# Patient Record
Sex: Female | Born: 1990
Health system: Southern US, Community
[De-identification: ages and names within clinical notes are randomized; demographics above are authoritative.]

## PROBLEM LIST (undated history)

## (undated) DIAGNOSIS — K589 Irritable bowel syndrome without diarrhea: Secondary | ICD-10-CM

## (undated) DIAGNOSIS — I471 Supraventricular tachycardia, unspecified: Secondary | ICD-10-CM

## (undated) DIAGNOSIS — E079 Disorder of thyroid, unspecified: Secondary | ICD-10-CM

## (undated) DIAGNOSIS — R1310 Dysphagia, unspecified: Secondary | ICD-10-CM

## (undated) DIAGNOSIS — I959 Hypotension, unspecified: Secondary | ICD-10-CM

## (undated) DIAGNOSIS — R1013 Epigastric pain: Secondary | ICD-10-CM

## (undated) HISTORY — DX: Supraventricular tachycardia, unspecified: I47.10

## (undated) HISTORY — DX: Hypotension, unspecified: I95.9

## (undated) HISTORY — DX: Dysphagia, unspecified: R13.10

## (undated) HISTORY — DX: Irritable bowel syndrome, unspecified: K58.9

## (undated) HISTORY — PX: OTHER SURGICAL HISTORY: SHX169

## (undated) HISTORY — DX: Epigastric pain: R10.13

## (undated) HISTORY — DX: Supraventricular tachycardia: I47.1

---
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
## 2012-06-05 ENCOUNTER — Other Ambulatory Visit: Payer: Self-pay | Admitting: Gastroenterology

## 2012-06-05 DIAGNOSIS — R1013 Epigastric pain: Secondary | ICD-10-CM

## 2012-06-05 DIAGNOSIS — R1310 Dysphagia, unspecified: Secondary | ICD-10-CM

## 2012-06-07 ENCOUNTER — Ambulatory Visit
Admission: RE | Admit: 2012-06-07 | Discharge: 2012-06-07 | Disposition: A | Discharge status: Home / Self Care | Payer: 59 | Source: Ambulatory Visit | Attending: Gastroenterology | Admitting: Gastroenterology

## 2012-06-07 DIAGNOSIS — R1310 Dysphagia, unspecified: Secondary | ICD-10-CM

## 2012-06-07 DIAGNOSIS — R1013 Epigastric pain: Secondary | ICD-10-CM

## 2013-06-15 ENCOUNTER — Encounter (HOSPITAL_COMMUNITY): Payer: Self-pay | Admitting: Emergency Medicine

## 2013-06-15 ENCOUNTER — Emergency Department (HOSPITAL_COMMUNITY)
Admission: EM | Admit: 2013-06-15 | Discharge: 2013-06-15 | Disposition: A | Discharge status: Home / Self Care | Payer: 59 | Attending: Emergency Medicine | Admitting: Emergency Medicine

## 2013-06-15 DIAGNOSIS — Z3202 Encounter for pregnancy test, result negative: Secondary | ICD-10-CM | POA: Insufficient documentation

## 2013-06-15 DIAGNOSIS — R110 Nausea: Secondary | ICD-10-CM | POA: Insufficient documentation

## 2013-06-15 DIAGNOSIS — I471 Supraventricular tachycardia: Secondary | ICD-10-CM

## 2013-06-15 DIAGNOSIS — I498 Other specified cardiac arrhythmias: Secondary | ICD-10-CM | POA: Insufficient documentation

## 2013-06-15 DIAGNOSIS — Z87891 Personal history of nicotine dependence: Secondary | ICD-10-CM | POA: Insufficient documentation

## 2013-06-15 LAB — BASIC METABOLIC PANEL
CO2: 25 mEq/L (ref 19–32)
GFR calc Af Amer: >90 mL/min (ref >90)
GFR calc non Af Amer: >90 mL/min (ref >90)
Potassium: 3.6 mEq/L (ref 3.5–5.1)
Sodium: 140 mEq/L (ref 135–145)

## 2013-06-15 LAB — CBC WITH DIFFERENTIAL/PLATELET
Basophils Relative: 0 % (ref 0–1)
HCT: 44.9 % (ref 36.0–46.0)
Hemoglobin: 15 g/dL (ref 12.0–15.0)
MCH: 28.5 pg (ref 26.0–34.0)
MCHC: 33.4 g/dL (ref 30.0–36.0)
Monocytes Absolute: 0.7 10*3/uL (ref 0.1–1.0)
Monocytes Relative: 7 % (ref 3–12)
Neutro Abs: 6.9 10*3/uL (ref 1.7–7.7)
Platelets: 301 10*3/uL (ref 150–400)
RBC: 5.27 MIL/uL — ABNORMAL HIGH (ref 3.87–5.11)

## 2013-06-15 LAB — RAPID URINE DRUG SCREEN, HOSP PERFORMED: Tetrahydrocannabinol: NOT DETECTED

## 2013-06-15 LAB — D-DIMER, QUANTITATIVE: D-Dimer, Quant: 0.28 ug/mL-FEU (ref 0.00–0.48)

## 2013-06-15 MED ORDER — METOPROLOL TARTRATE 1 MG/ML IV SOLN: 2.5000 mg | Freq: Once | INTRAVENOUS | Status: DC

## 2013-06-15 MED ORDER — SODIUM CHLORIDE 0.9 % IV BOLUS (SEPSIS)
1000.0000 mL | Freq: Once | INTRAVENOUS | Status: AC
  Administered 2013-06-15: 1000 mL via INTRAVENOUS

## 2013-06-15 MED ORDER — ADENOSINE 6 MG/2ML IV SOLN
INTRAVENOUS | Status: AC
  Administered 2013-06-15: 6 mg
  Filled 2013-06-15: qty 2

## 2013-06-15 MED ORDER — ADENOSINE 6 MG/2ML IV SOLN
INTRAVENOUS | Status: AC
  Administered 2013-06-15: 12 mg
  Filled 2013-06-15: qty 4

## 2013-06-15 MED ORDER — ONDANSETRON HCL 4 MG/2ML IJ SOLN
4.0000 mg | Freq: Once | INTRAMUSCULAR | Status: AC
  Administered 2013-06-15: 4 mg via INTRAVENOUS
  Filled 2013-06-15: qty 2

## 2013-06-15 NOTE — ED Provider Notes (Signed)
CSN: [id number]     Arrival date & time 06/15/13  1801 History   First MD Initiated Contact with Patient 06/15/13 1824     Chief Complaint  Patient presents with  . Tachycardia   (Consider location/radiation/quality/duration/timing/severity/associated sxs/prior Treatment) HPI Onset was about an hour ago, sudden, contant.  The pain is moderate pain to central chest. Modifying factors: none.  Associated symptoms: nausea, no SOB.  Recent medical care: none. Pt reports similar symptoms about 9 times in the past but they have always resolved in about 30 minutes. Never received medical care for this.    History reviewed. No pertinent past medical history. History reviewed. No pertinent past surgical history. History reviewed. No pertinent family history. History  Substance Use Topics  . Smoking status: Former Games developer  . Smokeless tobacco: Never Used  . Alcohol Use: Not on file   OB History   Grav Para Term Preterm Abortions TAB SAB Ect Mult Living                 Review of Systems Constitutional: Negative for fever.  Eyes: Negative for vision loss.  ENT: Negative for difficulty swallowing.  Cardiovascular: Positive for chest pain. Respiratory: Negative for respiratory distress.  Gastrointestinal:  Negative for vomiting.  Genitourinary: Negative for inability to void.  Musculoskeletal: Negative for gait problem.  Integumentary: Negative for rash.  Neurological: Negative for new focal weakness.     Allergies  Review of patient's allergies indicates no known allergies.  Home Medications   Current Outpatient Rx  Name  Route  Sig  Dispense  Refill  . gabapentin (NEURONTIN) 300 MG capsule   Oral   Take 300 mg by mouth daily as needed. For pain          BP 123/74  Pulse 120  Temp(Src) 99.1 F (37.3 C) (Oral)  Resp 15  SpO2 100%  LMP 06/08/2013 Physical Exam Nursing note and vitals reviewed.  Constitutional: Pt is alert and appears stated age. Uncomfortable appearing.   Eyes: No injection, no scleral icterus. HENT: Atraumatic, airway open without erythema or exudate.  Respiratory: No respiratory distress. Equal breathing bilaterally. Cardiovascular: Regular rhythm, tachycardic rate. Extremities warm and well perfused.  Abdomen: Soft, non-tender. MSK: Extremities are atraumatic without deformity. Skin: No rash, no wounds.   Neuro: No motor nor sensory deficit.     ED Course  Procedures (including critical care time) Labs Review Labs Reviewed  CBC WITH DIFFERENTIAL - Abnormal; Notable for the following:    RBC 5.27 (*)    All other components within normal limits  BASIC METABOLIC PANEL  D-DIMER, QUANTITATIVE  URINE RAPID DRUG SCREEN (HOSP PERFORMED)  TSH  POCT PREGNANCY, URINE   Imaging Review No results found.  EKG Interpretation     Ventricular Rate:  95 PR Interval:  163 QRS Duration: 90 QT Interval:  351 QTC Calculation: 441 R Axis:   42 Text Interpretation:  Sinus rhythm now sinus rhythm            MDM   1. SVT (supraventricular tachycardia)    22 y.o. female w/ PMHx of palpitations presents in SVT, HR 190 - 200. Pt stable with normal mental status, blood pressure. Given adenosine 6 mg with no change. 12 mg with resolution. Pt monitored in ED without event. Repeat EKG sinus rhythm. HR <100 on my re-eval. D dimer neg. Not PE. CBC without anemia. BMP without electrolyte abnormality. U preg neg. TSH pending. Uncertain cause for SVT. Will have pt f/u with  cards. Counseling provided regarding diagnosis, treatment plan, follow up recommendations, and return precautions. Questions answer       I independently viewed, interpreted, and used in my medical decision making all ordered lab and imaging tests. Medical Decision Making discussed with ED attending Glynn Octave, MD      Charm Barges, MD 06/15/13 2026

## 2013-06-15 NOTE — ED Provider Notes (Signed)
I saw and evaluated the patient, reviewed the resident's note and I agree with the findings and plan. If applicable, I agree with the resident's interpretation of the EKG.  If applicable, I was present for critical portions of any procedures performed.  One hour palpitations with chest pain shortness of breath. SVT with rate of 200. Adenosine x 2 with transition to sinus tach.  D-dimer negative.  CRITICAL CARE Performed by: Glynn Octave Total critical care time: 30 Critical care time was exclusive of separately billable procedures and treating other patients. Critical care was necessary to treat or prevent imminent or life-threatening deterioration. Critical care was time spent personally by me on the following activities: development of treatment plan with patient and/or surrogate as well as nursing, discussions with consultants, evaluation of patient's response to treatment, examination of patient, obtaining history from patient or surrogate, ordering and performing treatments and interventions, ordering and review of laboratory studies, ordering and review of radiographic studies, pulse oximetry and re-evaluation of patient's condition.   Glynn Octave, MD 06/15/13 2141

## 2013-06-15 NOTE — ED Notes (Signed)
Pt was with mother getting something to eat, started to take a sip of soda and her heart began to race,

## 2013-06-15 NOTE — ED Notes (Signed)
Family at bedside. 

## 2013-06-15 NOTE — ED Notes (Signed)
Pt HR did not respond to initial dose of adenosine, preparing to administer second dose per MD Rancour

## 2013-06-21 ENCOUNTER — Ambulatory Visit (INDEPENDENT_AMBULATORY_CARE_PROVIDER_SITE_OTHER): Payer: 59 | Admitting: Cardiovascular Disease

## 2013-06-21 ENCOUNTER — Encounter: Payer: Self-pay | Admitting: Cardiovascular Disease

## 2013-06-21 VITALS — BP 122/80 | HR 81 | Ht 67.0 in | Wt 153.4 lb

## 2013-06-21 DIAGNOSIS — I471 Supraventricular tachycardia: Secondary | ICD-10-CM | POA: Insufficient documentation

## 2013-06-21 NOTE — Progress Notes (Signed)
06/21/2013 Christine Moreno   1991-07-05  [id number]  Primary Physician Tomi Bamberger, NP Primary Cardiologist: Runell Gess MD Roseanne Reno   HPI:  Christine Moreno is a playful 22 year old single Caucasian female who works at a [employer] closed. [facility] for evaluation of PSVT which recently was pharmacologically terminated using IV adenosine. She's had 5 episodes per her lifetime most recently several days ago and prior to that one month ago. She denies caffeine intake, excessive alcohol use or illicit drugs. She stopped smoking 2 years ago. There is a family history of heart disease. During this episode she feels presyncopal and gets chest pain or shortness of breath. There is no evidence of preexcitation on her EKG.   Current Outpatient Prescriptions  Medication Sig Dispense Refill  . clindamycin (CLEOCIN T) 1 % lotion Apply 1 application topically daily.      . Doxycycline Hyclate-Cleanser (MORGIDOX) 2 X 100 MG KIT Apply 1 application topically daily.      Marland Kitchen EPIDUO 0.1-2.5 % gel Apply 1 application topically daily.      Marland Kitchen gabapentin (NEURONTIN) 300 MG capsule Take 300 mg by mouth daily as needed. For pain      . mesalamine (LIALDA) 1.2 G EC tablet Take 2,400 mg by mouth daily with breakfast.      . MINOCYCLINE HCL PO Take 1 tablet by mouth 2 (two) times daily.       No current facility-administered medications for this visit.    No Known Allergies  History   Social History  . Marital Status: Single    Spouse Name: N/A    Number of Children: N/A  . Years of Education: N/A   Occupational History  . Not on file.   Social History Main Topics  . Smoking status: Former Smoker    Quit date: 02/21/2011  . Smokeless tobacco: Never Used  . Alcohol Use: Not on file  . Drug Use: Not on file  . Sexual Activity: Not on file   Other Topics Concern  . Not on file   Social History Narrative  . No narrative on file     Review of  Systems: General: negative for chills, fever, night sweats or weight changes.  Cardiovascular: negative for chest pain, dyspnea on exertion, edema, orthopnea, palpitations, paroxysmal nocturnal dyspnea or shortness of breath Dermatological: negative for rash Respiratory: negative for cough or wheezing Urologic: negative for hematuria Abdominal: negative for nausea, vomiting, diarrhea, bright red blood per rectum, melena, or hematemesis Neurologic: negative for visual changes, syncope, or dizziness All other systems reviewed and are otherwise negative except as noted above.    Blood pressure 122/80, pulse 81, height 5\' 7"  (1.702 m), weight 153 lb 6.4 oz (69.582 kg), last menstrual period 06/08/2013.  General appearance: alert and no distress Neck: no adenopathy, no carotid bruit, no JVD, supple, symmetrical, trachea midline and thyroid not enlarged, symmetric, no tenderness/mass/nodules Lungs: clear to auscultation bilaterally Heart: regular rate and rhythm, S1, S2 normal, no murmur, click, rub or gallop Abdomen: soft, non-tender; bowel sounds normal; no masses,  no organomegaly Extremities: extremities normal, atraumatic, no cyanosis or edema Pulses: 2+ and symmetric  EKG sinus rhythm at 81 with RSR prime in leads V1 and V2 suggesting RV conduction delay.  ASSESSMENT AND PLAN:   PSVT (paroxysmal supraventricular tachycardia) Patient is a 22 year old single Caucasian female Caucasian female referred by [facility] ER for recent episode of PSVT. Her primary care provider is Tomi Bamberger, nurse practitioner in [place] .  She has no cardiac risk factors other than family history (Father had stents and bypass surgery in his early 72s). She stopped  smoking 2 years ago. She does not use illicit drugs and drinks socially. She does have irritable bowel syndrome. She's had 5 episodes of PSVT prior left eye beginning in early childhood and again in September and most recently this past Friday. Her 196. She received 2  intravenous doses of adenosine in the emergency room sinus rhythm. She denies other stimulants or exacerbating factors.      Runell Gess MD FACP,FACC,FAHA, [facility] 06/21/2013 4:27 PM

## 2013-06-21 NOTE — Assessment & Plan Note (Signed)
Patient is a 22 year old single Caucasian female referred by [facility] ER for recent episode of PSVT. Her primary care provider is Tomi Bamberger, nurse practitioner in [place] . She has no cardiac risk factors other than family history (Father had stents and bypass surgery in his early 68s). She stopped  smoking 2 years ago. She does not use illicit drugs and drinks socially. She does have irritable bowel syndrome. She's had 5 episodes of PSVT prior left eye beginning in early childhood and again in September and most recently this past Friday. Her 196. She received 2 intravenous doses of adenosine in the emergency room sinus rhythm. She denies other stimulants or exacerbating factors.

## 2013-06-21 NOTE — Patient Instructions (Signed)
Follow up with Dr Allyson Sabal only as needed.  Dr Allyson Sabal has referred you to Dr Johney Frame (the electrophysiologist for the PSVT your experiencing)  Dr Allyson Sabal has ordered an echocardiogram

## 2013-07-11 ENCOUNTER — Ambulatory Visit (HOSPITAL_COMMUNITY)
Admission: RE | Admit: 2013-07-11 | Discharge: 2013-07-11 | Disposition: A | Discharge status: Home / Self Care | Payer: 59 | Source: Ambulatory Visit | Attending: Cardiovascular Disease | Admitting: Cardiovascular Disease

## 2013-07-11 DIAGNOSIS — Z87891 Personal history of nicotine dependence: Secondary | ICD-10-CM | POA: Insufficient documentation

## 2013-07-11 DIAGNOSIS — R0989 Other specified symptoms and signs involving the circulatory and respiratory systems: Secondary | ICD-10-CM | POA: Insufficient documentation

## 2013-07-11 DIAGNOSIS — Z8249 Family history of ischemic heart disease and other diseases of the circulatory system: Secondary | ICD-10-CM | POA: Insufficient documentation

## 2013-07-11 DIAGNOSIS — I471 Supraventricular tachycardia, unspecified: Secondary | ICD-10-CM | POA: Insufficient documentation

## 2013-07-11 DIAGNOSIS — R079 Chest pain, unspecified: Secondary | ICD-10-CM | POA: Insufficient documentation

## 2013-07-11 DIAGNOSIS — R0609 Other forms of dyspnea: Secondary | ICD-10-CM | POA: Insufficient documentation

## 2013-07-11 NOTE — Progress Notes (Signed)
2D Echo Performed 07/11/2013    Devery Murgia, RCS  

## 2013-07-16 ENCOUNTER — Encounter: Payer: Self-pay | Admitting: *Deleted

## 2013-07-25 ENCOUNTER — Ambulatory Visit (INDEPENDENT_AMBULATORY_CARE_PROVIDER_SITE_OTHER): Payer: 59 | Admitting: Internal Medicine

## 2013-07-25 ENCOUNTER — Encounter: Payer: Self-pay | Admitting: Internal Medicine

## 2013-07-25 VITALS — BP 121/81 | HR 75 | Ht 67.0 in | Wt 155.8 lb

## 2013-07-25 DIAGNOSIS — I471 Supraventricular tachycardia: Secondary | ICD-10-CM

## 2013-07-25 DIAGNOSIS — I498 Other specified cardiac arrhythmias: Secondary | ICD-10-CM

## 2013-07-25 MED ORDER — DILTIAZEM HCL 30 MG PO TABS: 30.0000 mg | ORAL_TABLET | Freq: Four times a day (QID) | ORAL | Status: DC

## 2013-07-25 NOTE — Patient Instructions (Signed)
Your physician recommends that you schedule a follow-up appointment in: 3 months with Dr Johney Frame  Your physician has recommended you make the following change in your medication:  1) Start Diltiazem 30mg  every 6 hours as needed for palpitations  Call Anselm Pancoast if you want to schedule ablation

## 2013-07-25 NOTE — Progress Notes (Signed)
Primary Care Physician: Tomi Bamberger, NP Referring Physician:  Dr Corbin Ade is a 22 y.o. female with a h/o SVT who presents for Ep consultation.  She reports having abrupt onset/ gradual offset of tachypalpitations at around age 23.  This was only a single episode and terminated abruptly.  She [facility] then and her vitals were "OK".  She did not seek medicine attention.  She did well until this past year.  She has begun having symptoms of recurrent tachypalpitations.  She reports abrupt onset/ gradual offset of tachypalpitations.  She reports associated SOB, chest tightness and presyncope.  She has not had syncope.  She reports that her heart rates are over 200 bpm at the time.  Episodes last from 30 minutes to an hour.  She has tried vagal maneuvers without improvement.  She is unaware of any triggers or precipitants.  She thinks that she has had 5 episodes of tachycardia.  Her most recent episode occurred 10/14 and was terminated with adenosine.  She was documented to have a short RP SVT at 193 bpm.  She was referred to Dr Allyson Sabal and had an echo obtained.  She is referred for EP consultation.    She does have a h/o syncope several years ago (between the ages of 58 and 89).  She was evaluated by Dr Lily Kocher and diagnosed with POTS.  She was treated with hydration and flornef.  This has nearly resolved.  She rare postural dizziness.  Today, she denies symptoms of exertional chest pain, shortness of breath,  lower extremity edema, or neurologic sequela.  She reports chronic L ankle swelling since age 19 or so.  The patient is tolerating medications without difficulties and is otherwise without complaint today.   Past Medical History  Diagnosis Date  . Hypotension     previously treated for POTS by Dr Lily Kocher  . PSVT (paroxysmal supraventricular tachycardia)     Has had 5 total episodes in her life. One was when she went to ED 06/15/13 and one other about a month earlier.    . IBS (irritable bowel syndrome)   . Epigastric pain   . Dysphagia    Past Surgical History  Procedure Laterality Date  . Appendectomy  08/1993  . Left knee arthroscopy      Current Outpatient Prescriptions  Medication Sig Dispense Refill  . Doxycycline Hyclate-Cleanser 2 X 100 MG KIT Apply 1 application topically daily.      Marland Kitchen EPIDUO 0.1-2.5 % gel Apply 1 application topically daily.      Marland Kitchen gabapentin (NEURONTIN) 300 MG capsule Take 300 mg by mouth daily as needed. For pain       No current facility-administered medications for this visit.    No Known Allergies  History   Social History  . Marital Status: Single    Spouse Name: N/A    Number of Children: N/A  . Years of Education: N/A   Occupational History  . Not on file.   Social History Main Topics  . Smoking status: Former Smoker    Quit date: 02/21/2011  . Smokeless tobacco: Never Used  . Alcohol Use: Yes     Comment: Socially  . Drug Use: No  . Sexual Activity: Not on file   Other Topics Concern  . Not on file   Social History Narrative   Lives in [place] with her parents.  She works at a [employer], materials.    Family History  Problem Relation  Age of Onset  . Hyperlipidemia Mother   . Hypertension Father   . Hyperlipidemia Father   . Heart disease Father     Stent & bypass surgery in early 12s  . Hypertension Sister 59  . Heart disease Maternal Grandfather     ROS- All systems are reviewed and negative except as per the HPI above  Physical Exam: Filed Vitals:   07/25/13 1045  BP: 121/81  Pulse: 75  Height: 5\' 7"  (1.702 m)  Weight: 155 lb 12.8 oz (70.67 kg)    GEN- The patient is anxious appearing, alert and oriented x 3 today.   Head- normocephalic, atraumatic Eyes-  Sclera clear, conjunctiva pink Ears- hearing intact Oropharynx- clear, she has two earring type studs in the angles of her mouth Neck- supple, no JVP Lymph- no cervical lymphadenopathy Lungs- Clear to ausculation  bilaterally, normal work of breathing Heart- Regular rate and rhythm, no murmurs, rubs or gallops, PMI not laterally displaced GI- soft, NT, ND, + BS Extremities- no clubbing, cyanosis, or edema MS- no significant deformity or atrophy Skin- no rash or lesion Psych- euthymic mood, full affect Neuro- strength and sensation are intact  EKG 10/14 reveals short RP SVT at 193 bpm ekg today reveals sinus rhythm 75 bpm, PR 138, QRS 84, Qtc 410, incomplete RBBB Echo 11/14 is reviewed Dr Hazle Coca records are reviewed  Assessment and Plan:  The patient has symptomatic paroxysmal supraventricular tachycardia.  This has been documented to be short RP by recent ekg. Therapeutic strategies for supraventricular tachycardia including medicine and ablation were discussed in detail with the patient today. Risk, benefits, and alternatives to EP study and radiofrequency ablation were also discussed in detail today. At this time, she wishes to defer ablation.  She may reconsider with time.  For now, she would like to take diltiazem on an as needed basis.  If her SVT burden increases or should she decide to pursue ablation then she will contact my office.  I do think that she is a good candidate for ablation and have provided her with educational material on ablation today.  I will see her back in 3 months for further discussion.

## 2013-10-03 IMAGING — CR DG UGI W/ SMALL BOWEL HIGH DENSITY
6 series · 6 of 6 positions shown · non-contrast
Comparison: None.

CLINICAL DATA: Epigastric pain and dysphagia.

UPPER GI W/ SMALL BOWEL HIGH DENSITY
TECHNIQUE: Upper GI series performed with high density barium and
effervescent agent. Thin barium also used. Subsequently, serial
images of the small bowel were obtained including spot views of the
terminal ileum.
Fluoroscopy Time:

[view not recorded (1 of 6)]
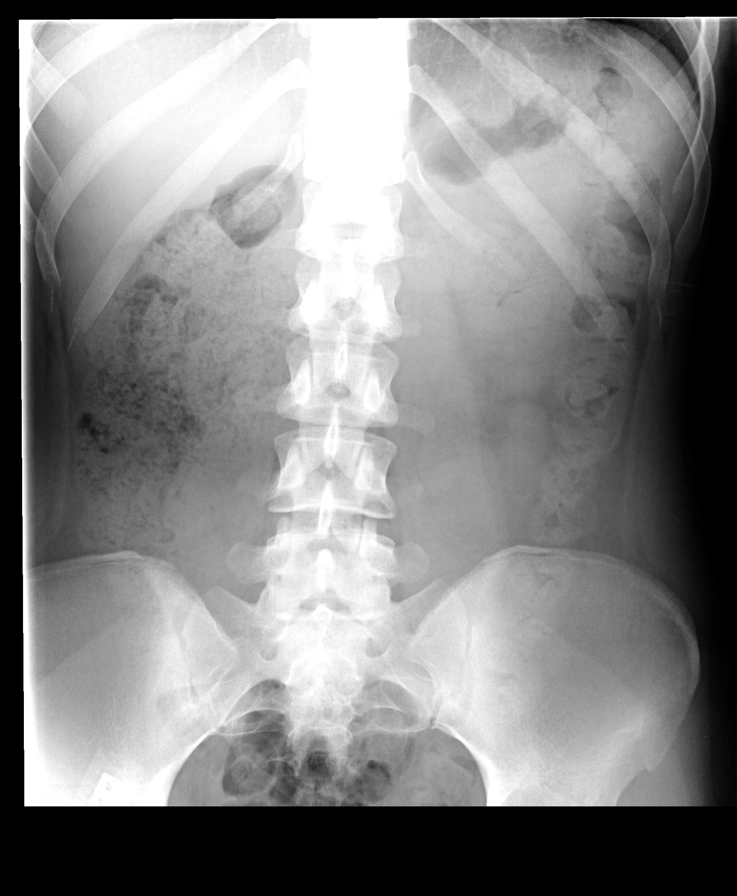

[view not recorded (2 of 6)]
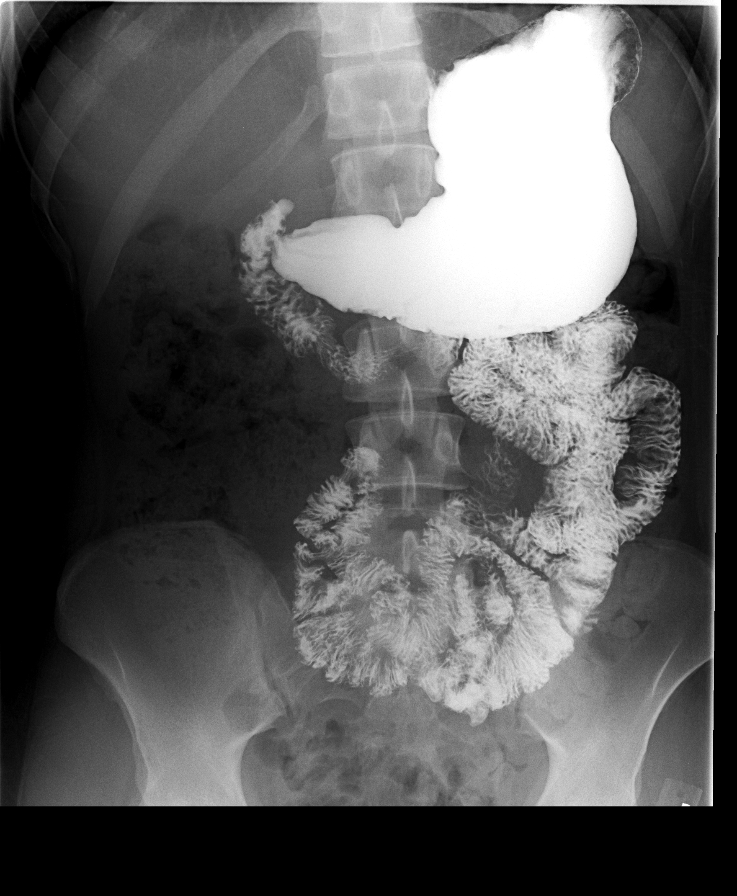

[view not recorded (3 of 6)]
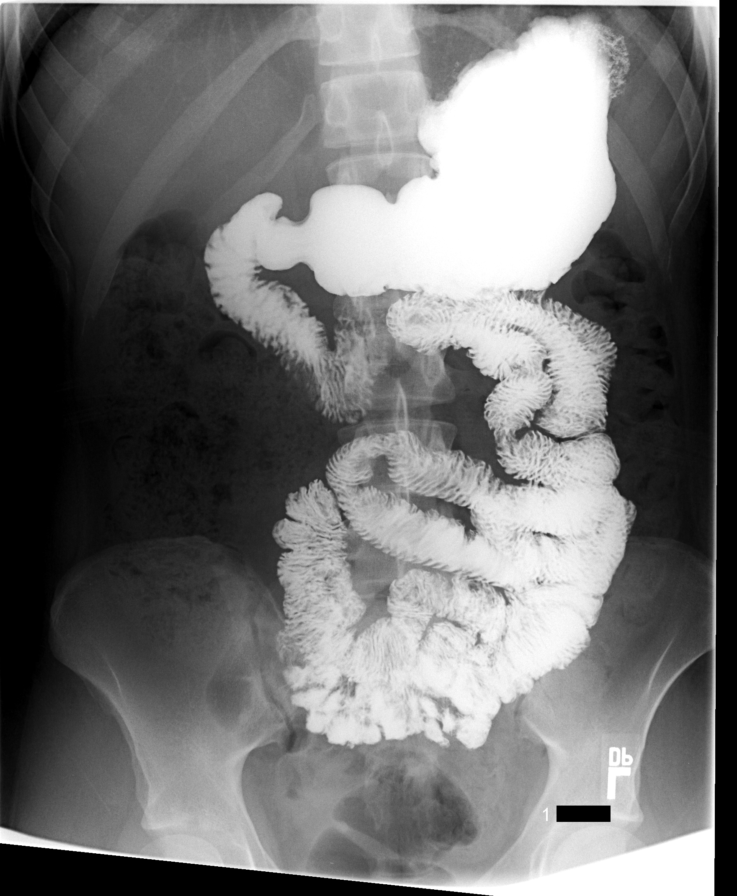

[view not recorded (4 of 6)]
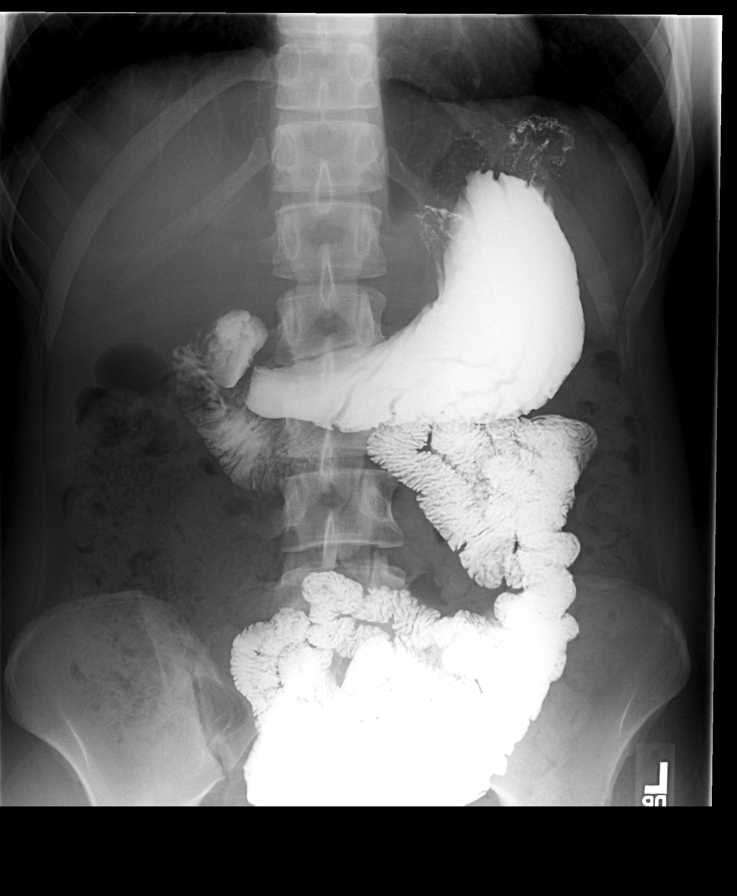

[view not recorded (5 of 6)]
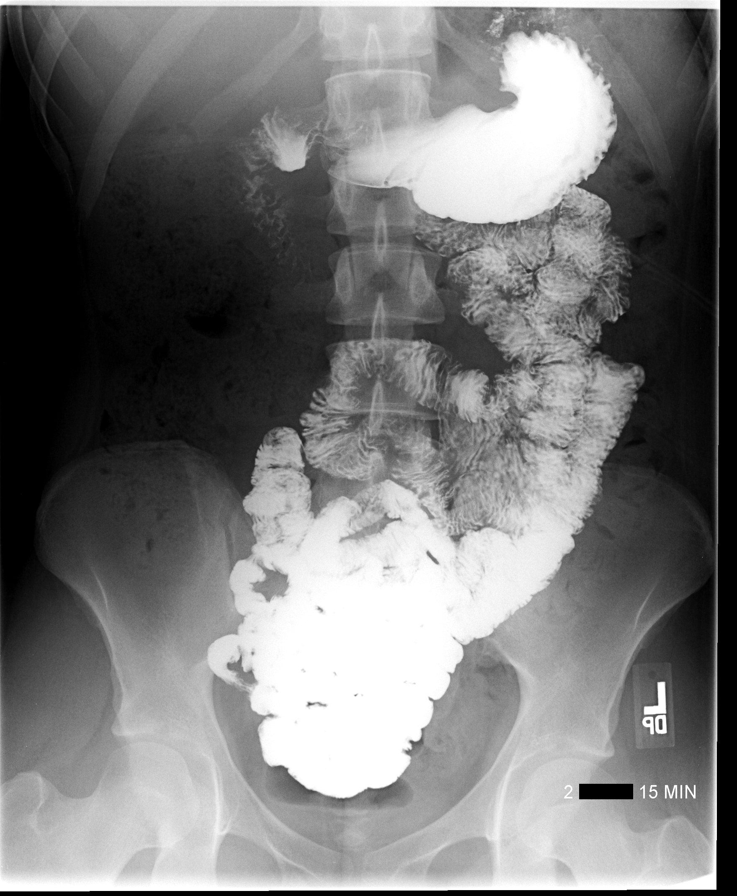

[view not recorded (6 of 6)]
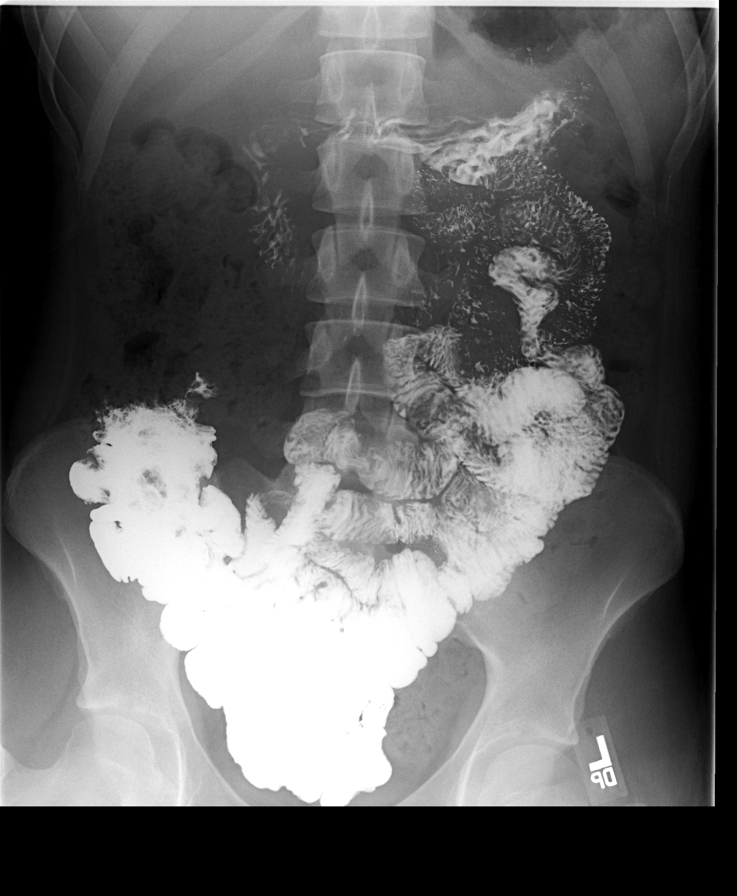

[6 of 6 positions shown; findings below may reference images not displayed]

FINDINGS: Scout view of the abdomen shows stool throughout the
colon.  No unexpected radiopaque calculi.

Esophagram shows no esophageal fold thickening, stricture or
obstruction.  Esophageal motility is normal.  A 13 mm barium pill
passed into the stomach without difficulty.

Stomach, duodenal C-loop, remainder of the small bowel and terminal
ileum are normal.  Normal fold pattern.  Small bowel transit time
is approximately 3 hours.
IMPRESSION: Normal examination.

## 2013-10-24 ENCOUNTER — Encounter: Payer: Self-pay | Admitting: Internal Medicine

## 2013-10-24 ENCOUNTER — Ambulatory Visit (INDEPENDENT_AMBULATORY_CARE_PROVIDER_SITE_OTHER): Payer: 59 | Admitting: Internal Medicine

## 2013-10-24 VITALS — BP 114/78 | HR 113 | Ht 67.0 in | Wt 153.0 lb

## 2013-10-24 DIAGNOSIS — R42 Dizziness and giddiness: Secondary | ICD-10-CM

## 2013-10-24 DIAGNOSIS — I471 Supraventricular tachycardia: Secondary | ICD-10-CM

## 2013-10-24 DIAGNOSIS — I951 Orthostatic hypotension: Secondary | ICD-10-CM | POA: Insufficient documentation

## 2013-10-24 LAB — CBC WITH DIFFERENTIAL/PLATELET
Basophils Absolute: 0 10*3/uL (ref 0.0–0.1)
Basophils Relative: 0.5 % (ref 0.0–3.0)
Eosinophils Absolute: 0 10*3/uL (ref 0.0–0.7)
Eosinophils Relative: 0.9 % (ref 0.0–5.0)
HCT: 41.5 % (ref 36.0–46.0)
HEMOGLOBIN: 13.9 g/dL (ref 12.0–15.0)
LYMPHS PCT: 33.7 % (ref 12.0–46.0)
Lymphs Abs: 2 10*3/uL (ref 0.7–4.0)
MCHC: 33.5 g/dL (ref 30.0–36.0)
MCV: 87.2 fl (ref 78.0–100.0)
MONOS PCT: 5 % (ref 3.0–12.0)
Monocytes Absolute: 0.3 10*3/uL (ref 0.1–1.0)
NEUTROS ABS: 3.5 10*3/uL (ref 1.4–7.7)
Neutrophils Relative %: 59.9 % (ref 43.0–77.0)
Platelets: 270 10*3/uL (ref 150.0–400.0)
RBC: 4.77 Mil/uL (ref 3.87–5.11)
RDW: 12.3 % (ref 11.5–14.6)
WBC: 5.8 10*3/uL (ref 4.5–10.5)

## 2013-10-24 LAB — BASIC METABOLIC PANEL
BUN: 7 mg/dL (ref 6–23)
CO2: 25 meq/L (ref 19–32)
Calcium: 9.2 mg/dL (ref 8.4–10.5)
Chloride: 104 mEq/L (ref 96–112)
Creatinine, Ser: 0.7 mg/dL (ref 0.4–1.2)
GFR: 116.62 mL/min (ref >60.00)
Glucose, Bld: 78 mg/dL (ref 70–99)
Potassium: 3.9 mEq/L (ref 3.5–5.1)
Sodium: 137 mEq/L (ref 135–145)

## 2013-10-24 LAB — TSH: TSH: 2.92 u[IU]/mL (ref 0.35–5.50)

## 2013-10-24 LAB — FERRITIN: FERRITIN: 19 ng/mL (ref 10.0–291.0)

## 2013-10-24 NOTE — Progress Notes (Signed)
PCP: Delia Chimes, NP Primary Cardiologist:  Dr Su Hilt is a 23 y.o. female who presents today for routine electrophysiology followup.  Since last being seen in our clinic, the patient reports doing very well.  She had only 1 episode of SVT which stopped after 5 minutes with prn cardizem.  She has occasional postural dizziness.  This has been worsened recently as her menses has been more heavy.  Today, she denies symptoms of palpitations, chest pain, shortness of breath,  lower extremity edema, presyncope, or syncope.  The patient is otherwise without complaint today.   Past Medical History  Diagnosis Date  . Hypotension     previously treated for POTS by Dr Altamease Oiler  . PSVT (paroxysmal supraventricular tachycardia)     Has had 5 total episodes in her life. One was when she went to ED 06/15/13 and one other about a month earlier.  . IBS (irritable bowel syndrome)   . Epigastric pain   . Dysphagia    Past Surgical History  Procedure Laterality Date  . Appendectomy  08/1993  . Left knee arthroscopy      Current Outpatient Prescriptions  Medication Sig Dispense Refill  . diltiazem (CARDIZEM) 30 MG tablet Take 30 mg by mouth as needed.      . Doxycycline Hyclate-Cleanser 2 X 100 MG KIT Apply 1 application topically daily.      Marland Kitchen EPIDUO 0.1-2.5 % gel Apply 1 application topically daily.      Marland Kitchen gabapentin (NEURONTIN) 300 MG capsule Take 300 mg by mouth daily as needed. For pain       No current facility-administered medications for this visit.    Physical Exam: Filed Vitals:   10/24/13 1346  BP: 126/83  Pulse: 102  Height: $Remove'5\' 7"'VGetiTO$  (1.702 m)  Weight: 153 lb (69.4 kg)    GEN- The patient is well appearing, alert and oriented x 3 today.   Head- normocephalic, atraumatic Eyes-  Sclera clear, conjunctiva pink Ears- hearing intact Oropharynx- clear Lungs- Clear to ausculation bilaterally, normal work of breathing Heart- Regular rate and rhythm, no murmurs, rubs or  gallops, PMI not laterally displaced GI- soft, NT, ND, + BS Extremities- no clubbing, cyanosis, or edema  ekg today reveals sinus tachycardia 102 bpm, otherwise normal ekg  Assessment and Plan: The patient has symptomatic paroxysmal supraventricular tachycardia. This has been documented to be short RP by recent ekg.  She also has POTS.  Though I have advised ablation, she prefers to avoid this.  She would like to continue her current therapy.  She is profoundly orthostatic today.   I will obtain cbc/bmet/ferritin/ tsh today to further evaluate her symptoms.  Hydration is encouraged  Return in 1 year

## 2013-10-24 NOTE — Patient Instructions (Signed)
Your physician wants you to follow-up in: 12 months with Dr Jacquiline DoeAllred You will receive a reminder letter in the mail two months in advance. If you don't receive a letter, please call our office to schedule the follow-up appointment.  Labs today: CBC/BMP/TSH/FERRITIN

## 2014-10-10 ENCOUNTER — Telehealth: Payer: Self-pay | Admitting: Endocrinology

## 2014-10-10 ENCOUNTER — Encounter: Payer: Self-pay | Admitting: Endocrinology

## 2014-10-10 ENCOUNTER — Ambulatory Visit (INDEPENDENT_AMBULATORY_CARE_PROVIDER_SITE_OTHER): Payer: 59 | Admitting: Endocrinology

## 2014-10-10 VITALS — BP 126/80 | HR 93 | Temp 97.7°F | Ht 67.0 in | Wt 161.0 lb

## 2014-10-10 DIAGNOSIS — I471 Supraventricular tachycardia: Secondary | ICD-10-CM

## 2014-10-10 DIAGNOSIS — I951 Orthostatic hypotension: Secondary | ICD-10-CM

## 2014-10-10 LAB — CORTISOL
CORTISOL PLASMA: 24.5 ug/dL
Cortisol, Plasma: 35.5 ug/dL

## 2014-10-10 LAB — T4, FREE: Free T4: 1.06 ng/dL (ref 0.60–1.60)

## 2014-10-10 LAB — TSH: TSH: 2.58 u[IU]/mL (ref 0.35–4.50)

## 2014-10-10 LAB — T3, FREE: T3, Free: 3.2 pg/mL (ref 2.3–4.2)

## 2014-10-10 MED ORDER — COSYNTROPIN 0.25 MG IJ SOLR
0.2500 mg | Freq: Once | INTRAMUSCULAR | Status: AC
  Administered 2014-10-10: 0.25 mg via INTRAMUSCULAR

## 2014-10-10 NOTE — Patient Instructions (Addendum)
blood tests are being requested for you today.  We'll let you know about the results. Let's also check a 24-HR urine test.  Try to be active on the at day.  I would be happy to see you back here whenever you want.

## 2014-10-10 NOTE — Progress Notes (Signed)
Subjective:    Patient ID: Christine Moreno, female    DOB: 1990/11/27, 24 y.o.   MRN: [id number]  HPI Pt reports hypothyroidism was dx'ed in late 2015, based on an abnormal TSH.  She has been on prescribed thyroid hormone therapy since then.  He has never taken kelp or any other type of non-prescribed thyroid product.  He has never had thyroid imaging.  She is not considering a pregnancy.  He has never had thyroid surgery, or XRT to the neck.  He has never been on amiodarone or lithium.  Pt reports moderate cold intolerance throughout the body, and assoc severe fatigue.  She says this is preventing her from working.  Since on synthroid, sxs are not improved.  Pt says she never miss the synthroid.  She says she gets SVT with exertion.   Past Medical History  Diagnosis Date  . Hypotension     previously treated for POTS by Dr Lily Kocher  . PSVT (paroxysmal supraventricular tachycardia)     Has had 5 total episodes in her life. One was when she went to ED 06/15/13 and one other about a month earlier.  . IBS (irritable bowel syndrome)   . Epigastric pain   . Dysphagia     Past Surgical History  Procedure Laterality Date  . Appendectomy  08/1993  . Left knee arthroscopy      History   Social History  . Marital Status: Single    Spouse Name: N/A  . Number of Children: N/A  . Years of Education: N/A   Occupational History  . Not on file.   Social History Main Topics  . Smoking status: Former Smoker    Quit date: 02/21/2011  . Smokeless tobacco: Never Used  . Alcohol Use: Yes     Comment: Socially  . Drug Use: No  . Sexual Activity: Not on file   Other Topics Concern  . Not on file   Social History Narrative   Lives in [place] with her parents.  She works at a [employer], [employer].    Current Outpatient Prescriptions on File Prior to Visit  Medication Sig Dispense Refill  . diltiazem (CARDIZEM) 30 MG tablet Take 30 mg by mouth as needed.     No current facility-administered  medications on file prior to visit.    No Known Allergies  Family History  Problem Relation Age of Onset  . Hyperlipidemia Mother   . Hypertension Father   . Hyperlipidemia Father   . Heart disease Father     Stent & bypass surgery in early 32s  . Hypertension Sister 37  . Heart disease Maternal Grandfather   . Thyroid disease Neg Hx    BP 126/80 mmHg  Pulse 93  Temp(Src) 97.7 F (36.5 C) (Oral)  Ht  (1.702 m)  Wt 161 lb (73.029 kg)  BMI 25.21 kg/m2  SpO2 98%  Review of Systems denies sob, constipation, numbness, blurry vision, rhinorrhea, and syncope.  She has difficulty with concentration, anxiety, swelling of the face, acne, myalgias, weight gain, easy bruising, and palpitations.  She had reg menses prior to oral contraceptives.     Objective:   Physical Exam VS: see vs page GEN: no distress HEAD: head: no deformity eyes: no periorbital swelling, no proptosis external nose and ears are normal mouth: no lesion seen NECK: supple, thyroid is not enlarged CHEST WALL: no deformity LUNGS:  Clear to auscultation.   CV: reg rate and rhythm, no murmur ABD: abdomen is  soft, nontender.  no hepatosplenomegaly.  not distended.  no hernia MUSCULOSKELETAL: muscle bulk and strength are grossly normal.  no obvious joint swelling.  gait is normal and steady EXTEMITIES: no deformity.  Trace bilat leg edema.  NEURO:  cn 2-12 grossly intact.   readily moves all 4's.  sensation is intact to touch on all 4's.   SKIN:  Normal texture and temperature.  No rash or suspicious lesion is visible.   NODES:  None palpable at the neck PSYCH: alert, well-oriented.  Does not appear anxious nor depressed.   Lab Results  Component Value Date   TSH 2.58 10/10/2014   acth stimulation test is done: baseline cortisol level=25 then cosyntropin 250 mcg is given im 45 minutes later, cortisol level=36 (normal response)     Assessment & Plan:  Cold intolerance, new, uncertain etiology SVT,  new to me.  pheo should be excluded Hypotension: adrenal insuff is excluded.  Fatigue, severe, uncertain etiology.  No endocrine cause is found.     Patient is advised the following: Patient Instructions  blood tests are being requested for you today.  We'll let you know about the results. Let's also check a 24-HR urine test.  Try to be active on the at day.  I would be happy to see you back here whenever you want.

## 2014-10-10 NOTE — Telephone Encounter (Signed)
Patient returned your call.

## 2014-10-23 ENCOUNTER — Other Ambulatory Visit: Payer: 59

## 2014-10-23 ENCOUNTER — Other Ambulatory Visit: Payer: Self-pay | Admitting: *Deleted

## 2014-10-23 DIAGNOSIS — I471 Supraventricular tachycardia: Secondary | ICD-10-CM

## 2014-10-27 LAB — CATECHOLAMINES, FRACTIONATED, URINE, 24 HOUR
Calculated Total (E+NE): 31 mcg/24 h (ref 26–121)
Creatinine, Urine mg/day-CATEUR: 1.23 g/(24.h) (ref 0.63–2.50)
Dopamine, 24 hr Urine: 222 mcg/24 h (ref 52–480)
NOREPINEPHRINE, 24 HR UR: 31 ug/(24.h) (ref 15–100)
Total Volume - CF 24Hr U: 1300 mL

## 2014-10-27 LAB — METANEPHRINES, URINE, 24 HOUR
METANEPHRINES UR: 52 ug/(24.h) (ref 25–222)
Metaneph Total, Ur: 315 mcg/24 h (ref 94–604)
NORMETANEPHRINE 24H UR: 263 ug/(24.h) (ref 40–412)

## 2014-10-28 ENCOUNTER — Ambulatory Visit (INDEPENDENT_AMBULATORY_CARE_PROVIDER_SITE_OTHER): Payer: 59 | Admitting: Internal Medicine

## 2014-10-28 ENCOUNTER — Encounter: Payer: Self-pay | Admitting: Internal Medicine

## 2014-10-28 VITALS — BP 120/86 | HR 77 | Ht 67.0 in | Wt 166.0 lb

## 2014-10-28 DIAGNOSIS — I471 Supraventricular tachycardia: Secondary | ICD-10-CM

## 2014-10-28 DIAGNOSIS — I951 Orthostatic hypotension: Secondary | ICD-10-CM

## 2014-10-28 NOTE — Progress Notes (Signed)
PCP: Tomi BambergerFULLER,SUSAN, NP Primary Cardiologist:  Dr Corbin AdeBerry  Christine Moreno is a 24 y.o. female who presents today for routine electrophysiology followup.  Since last being seen in our clinic, the patient reports doing well.   She has occasional postural dizziness.   She had a job in early 2015 which required frequent bending.  She had frequent SVt with this.  She has since moved to another job and is doing better.  She has had no SVT since 9/15.  Today, she denies symptoms of palpitations, chest pain, shortness of breath,  lower extremity edema, presyncope, or syncope.  The patient is otherwise without complaint today.   Past Medical History  Diagnosis Date  . Hypotension     previously treated for POTS by Dr Lily KocherGomez  . PSVT (paroxysmal supraventricular tachycardia)     Has had 5 total episodes in her life. One was when she went to ED 06/15/13 and one other about a month earlier.  . IBS (irritable bowel syndrome)   . Epigastric pain   . Dysphagia    Past Surgical History  Procedure Laterality Date  . Appendectomy  08/1993  . Left knee arthroscopy      Current Outpatient Prescriptions  Medication Sig Dispense Refill  . diltiazem (CARDIZEM) 30 MG tablet Take 30 mg by mouth as needed.    . drospirenone-ethinyl estradiol (YASMIN,ZARAH,SYEDA) 3-0.03 MG tablet Take 1 tablet by mouth daily.    Marland Kitchen. levothyroxine (SYNTHROID, LEVOTHROID) 50 MCG tablet Take 50 mcg by mouth daily before breakfast.    . pantoprazole (PROTONIX) 20 MG tablet Take 20 mg by mouth daily.     No current facility-administered medications for this visit.    Physical Exam: Filed Vitals:   10/28/14 1502  BP: 120/86  Pulse: 77  Height: 5\' 7"  (1.702 m)  Weight: 166 lb (75.297 kg)    GEN- The patient is well appearing, alert and oriented x 3 today.   Head- normocephalic, atraumatic Eyes-  Sclera clear, conjunctiva pink Ears- hearing intact Oropharynx- clear Lungs- Clear to ausculation bilaterally, normal work of  breathing Heart- Regular rate and rhythm, no murmurs, rubs or gallops, PMI not laterally displaced GI- soft, NT, ND, + BS Extremities- no clubbing, cyanosis, or edema  ekg today reveals sinus rhythm 77 bpm, otherwise normal ekg  Assessment and Plan: The patient has symptomatic paroxysmal supraventricular tachycardia. This has been documented to be short RP by recent ekg.  She also has POTS.  Though I have advised ablation, she prefers to avoid this.  She would like to continue her current therapy.   Hydration is encouraged  Return in 1 year

## 2014-10-28 NOTE — Patient Instructions (Signed)
Your physician wants you to follow-up in: 12 months with Dr Allred You will receive a reminder letter in the mail two months in advance. If you don't receive a letter, please call our office to schedule the follow-up appointment.  

## 2015-01-24 ENCOUNTER — Ambulatory Visit (INDEPENDENT_AMBULATORY_CARE_PROVIDER_SITE_OTHER): Payer: 59 | Admitting: Endocrinology

## 2015-01-24 ENCOUNTER — Encounter: Payer: Self-pay | Admitting: Endocrinology

## 2015-01-24 VITALS — BP 128/82 | HR 84 | Ht 67.0 in | Wt 165.0 lb

## 2015-01-24 DIAGNOSIS — E039 Hypothyroidism, unspecified: Secondary | ICD-10-CM | POA: Diagnosis not present

## 2015-01-24 DIAGNOSIS — I951 Orthostatic hypotension: Secondary | ICD-10-CM | POA: Diagnosis not present

## 2015-01-24 NOTE — Progress Notes (Signed)
   Subjective:    Patient ID: Christine SantiagoKristen N Moreno, female    DOB: 1991/06/23, 24 y.o.   MRN: [id number]  HPI Pt returns for f/u of chronic primary hypothyroidism (dx'ed 2015; she has never had thyroid imaging). Severe fatigue (her worst symptom), and postural dizziness are unchanged.  She requests to change levothyroxine to armour thyroid.   Past Medical History  Diagnosis Date  . Hypotension     previously treated for POTS by Dr Lily KocherGomez  . PSVT (paroxysmal supraventricular tachycardia)     Has had 5 total episodes in her life. One was when she went to ED 06/15/13 and one other about a month earlier.  . IBS (irritable bowel syndrome)   . Epigastric pain   . Dysphagia     Past Surgical History  Procedure Laterality Date  . Appendectomy  08/1993  . Left knee arthroscopy      History   Social History  . Marital Status: Single    Spouse Name: N/A  . Number of Children: N/A  . Years of Education: N/A   Occupational History  . Not on file.   Social History Main Topics  . Smoking status: Former Smoker    Quit date: 02/21/2011  . Smokeless tobacco: Never Used  . Alcohol Use: Yes     Comment: Socially  . Drug Use: No  . Sexual Activity: Not on file   Other Topics Concern  . Not on file   Social History Narrative   Lives in [place] with her parents.  She works at a [employer], [employer].    Current Outpatient Prescriptions on File Prior to Visit  Medication Sig Dispense Refill  . diltiazem (CARDIZEM) 30 MG tablet Take 30 mg by mouth as needed.    . drospirenone-ethinyl estradiol (YASMIN,ZARAH,SYEDA) 3-0.03 MG tablet Take 1 tablet by mouth daily.    Marland Kitchen. levothyroxine (SYNTHROID, LEVOTHROID) 50 MCG tablet Take 50 mcg by mouth daily before breakfast.    . pantoprazole (PROTONIX) 20 MG tablet Take 20 mg by mouth daily.     No current facility-administered medications on file prior to visit.    No Known Allergies  Family History  Problem Relation Age of Onset  . Hyperlipidemia  Mother   . Hypertension Father   . Hyperlipidemia Father   . Heart disease Father     Stent & bypass surgery in early 6360s  . Hypertension Sister [id number]  . Heart disease Maternal Grandfather   . Thyroid disease Neg Hx     BP 128/82 mmHg  Pulse 84  Ht 5\' 7"  (1.702 m)  Wt 165 lb (74.844 kg)  BMI 25.84 kg/m2  SpO2 98%   Review of Systems She has lost weight, due to her efforts.    Objective:   Physical Exam VITAL SIGNS:  See vs page GENERAL: no distress NECK: There is no palpable thyroid enlargement.  No thyroid nodule is palpable.  No palpable lymphadenopathy at the anterior neck.       Assessment & Plan:  Hypothyroidism; on rx.  i explained that request for armour thyroid must be declined, for her safety).  Patient is advised the following: Patient Instructions  blood tests are being requested for you today.  We'll let you know about the results. I would be happy to see you back here whenever you want.

## 2015-01-24 NOTE — Patient Instructions (Addendum)
blood tests are being requested for you today.  We'll let you know about the results. I would be happy to see you back here whenever you want.   

## 2015-01-25 LAB — THYROID PEROXIDASE ANTIBODY: Thyroperoxidase Ab SerPl-aCnc: 1 IU/mL (ref <9)

## 2015-05-31 ENCOUNTER — Emergency Department
Admission: EM | Admit: 2015-05-31 | Discharge: 2015-05-31 | Disposition: A | Discharge status: Home / Self Care | Payer: 59 | Attending: Emergency Medicine | Admitting: Emergency Medicine

## 2015-05-31 ENCOUNTER — Encounter: Payer: Self-pay | Admitting: *Deleted

## 2015-05-31 DIAGNOSIS — Z793 Long term (current) use of hormonal contraceptives: Secondary | ICD-10-CM | POA: Insufficient documentation

## 2015-05-31 DIAGNOSIS — Z87891 Personal history of nicotine dependence: Secondary | ICD-10-CM | POA: Insufficient documentation

## 2015-05-31 DIAGNOSIS — Z79899 Other long term (current) drug therapy: Secondary | ICD-10-CM | POA: Diagnosis not present

## 2015-05-31 DIAGNOSIS — Z3202 Encounter for pregnancy test, result negative: Secondary | ICD-10-CM | POA: Diagnosis not present

## 2015-05-31 DIAGNOSIS — I471 Supraventricular tachycardia: Secondary | ICD-10-CM | POA: Diagnosis not present

## 2015-05-31 DIAGNOSIS — R002 Palpitations: Secondary | ICD-10-CM | POA: Diagnosis present

## 2015-05-31 LAB — CBC
HEMATOCRIT: 42.5 % (ref 35.0–47.0)
Hemoglobin: 14.5 g/dL (ref 12.0–16.0)
MCH: 28.4 pg (ref 26.0–34.0)
MCHC: 34.1 g/dL (ref 32.0–36.0)
MCV: 83.2 fL (ref 80.0–100.0)
PLATELETS: 336 10*3/uL (ref 150–440)
RBC: 5.1 MIL/uL (ref 3.80–5.20)
RDW: 12.4 % (ref 11.5–14.5)
WBC: 7.4 10*3/uL (ref 3.6–11.0)

## 2015-05-31 LAB — BASIC METABOLIC PANEL
ANION GAP: 10 (ref 5–15)
BUN: 8 mg/dL (ref 6–20)
CALCIUM: 9.6 mg/dL (ref 8.9–10.3)
CO2: 24 mmol/L (ref 22–32)
CREATININE: 0.62 mg/dL (ref 0.44–1.00)
Chloride: 104 mmol/L (ref 101–111)
Glucose, Bld: 109 mg/dL — ABNORMAL HIGH (ref 65–99)
Potassium: 3.5 mmol/L (ref 3.5–5.1)
SODIUM: 138 mmol/L (ref 135–145)

## 2015-05-31 LAB — HCG, QUANTITATIVE, PREGNANCY

## 2015-05-31 LAB — TSH: TSH: 0.506 u[IU]/mL (ref 0.350–4.500)

## 2015-05-31 MED ORDER — ADENOSINE 6 MG/2ML IV SOLN
INTRAVENOUS | Status: AC
  Administered 2015-05-31: 6 mg
  Filled 2015-05-31: qty 2

## 2015-05-31 MED ORDER — ADENOSINE 12 MG/4ML IV SOLN
INTRAVENOUS | Status: AC
  Administered 2015-05-31: 12 mg
  Filled 2015-05-31: qty 4

## 2015-05-31 NOTE — ED Notes (Signed)
Pt reports onset of SVT 30 minutes ago. Hx of same. HR 200 in triage.

## 2015-05-31 NOTE — ED Provider Notes (Signed)
[facility] Emergency Department Provider Note REMINDER - THIS NOTE IS NOT A FINAL MEDICAL RECORD UNTIL IT IS SIGNED. UNTIL THEN, THE CONTENT BELOW MAY REFLECT INFORMATION FROM A DOCUMENTATION TEMPLATE, NOT THE ACTUAL PATIENT VISIT. ____________________________________________  Time seen: Approximately 1:07 PM  I have reviewed the triage vital signs and the nursing notes.   HISTORY  Chief Complaint Tachycardia    HPI Christine Moreno is a 24 y.o. female with a history of SVT. Patient reports she was riding in a car about 30 minutes prior to arrival and noticed sudden onset of palpitations. She denies any chest pain or trouble breathing, she is states that she can feel her heart racing. She does feel slightly lightheaded with this. No nausea or vomiting. No abdominal pain. No fevers or chills.  The pertinent patient doesn't history of hypothyroidism and is on replacement therapy, but states she had her thyroid level checked about 2-3 weeks ago in the clinic and was told was okay.  She does take diltiazem as needed as a rescue medication for this at home, but did not have with her.  Past Medical History  Diagnosis Date  . Hypotension     previously treated for POTS by Dr Lily Kocher  . PSVT (paroxysmal supraventricular tachycardia) (HCC)     Has had 5 total episodes in her life. One was when she went to ED 06/15/13 and one other about a month earlier.  . IBS (irritable bowel syndrome)   . Epigastric pain   . Dysphagia     Patient Active Problem List   Diagnosis Date Noted  . Hypothyroidism 01/24/2015  . Orthostatic hypotension 10/24/2013  . PSVT (paroxysmal supraventricular tachycardia) (HCC) 06/21/2013    Past Surgical History  Procedure Laterality Date  . Appendectomy  08/1993  . Left knee arthroscopy      Current Outpatient Rx  Name  Route  Sig  Dispense  Refill  . diltiazem (CARDIZEM) 30 MG tablet   Oral   Take 30 mg by mouth as needed.          . drospirenone-ethinyl estradiol (YASMIN,ZARAH,SYEDA) 3-0.03 MG tablet   Oral   Take 1 tablet by mouth daily.         Marland Kitchen levothyroxine (SYNTHROID, LEVOTHROID) 50 MCG tablet   Oral   Take 50 mcg by mouth daily before breakfast.         . pantoprazole (PROTONIX) 20 MG tablet   Oral   Take 20 mg by mouth daily.           Allergies Review of patient's allergies indicates no known allergies.  Family History  Problem Relation Age of Onset  . Hyperlipidemia Mother   . Hypertension Father   . Hyperlipidemia Father   . Heart disease Father     Stent & bypass surgery in early 28s  . Hypertension Sister 87  . Heart disease Maternal Grandfather   . Thyroid disease Neg Hx     Social History Social History  Substance Use Topics  . Smoking status: Former Smoker    Quit date: 02/21/2011  . Smokeless tobacco: Never Used  . Alcohol Use: Yes     Comment: Socially    Review of Systems Constitutional: No fever/chills Eyes: No visual changes. Cardiovascular: Denies chest pain. Respiratory: Denies shortness of breath. Gastrointestinal: No abdominal pain.  No nausea, no vomiting.  No diarrhea.  No constipation. Genitourinary: Denies pregnancy.  10-point ROS otherwise negative.  ____________________________________________   PHYSICAL EXAM:  VITAL  SIGNS: ED Triage Vitals  Enc Vitals Group     BP 05/31/15 1244 146/107 mmHg     Pulse Rate 05/31/15 1244 198     Resp 05/31/15 1244 20     Temp 05/31/15 1244 98.2 F (36.8 C)     Temp Source 05/31/15 1244 Oral     SpO2 05/31/15 1244 97 %     Weight --      Height --      Head Cir --      Peak Flow --      Pain Score --      Pain Loc --      Pain Edu? --      Excl. in GC? --    Constitutional: Alert and oriented. Well appearing and in no acute distress. Eyes: Conjunctivae are normal. PERRL. EOMI. Head: Atraumatic. Nose: No congestion/rhinnorhea. Mouth/Throat: Mucous membranes are moist.  Oropharynx  non-erythematous. Neck: No stridor.   Cardiovascular: Extreme tachycardic rate, regular rhythm. Grossly normal heart sounds.  Good peripheral circulation. Respiratory: Normal respiratory effort.  No retractions. Lungs CTAB. Gastrointestinal: Soft and nontender. No distention. No abdominal bruits. No CVA tenderness. Musculoskeletal: No lower extremity tenderness nor edema.  No joint effusions. Neurologic:  Normal speech and language. No gross focal neurologic deficits are appreciated. Skin:  Skin is warm, dry and intact. No rash noted. Psychiatric: Mood and affect are normal. Speech and behavior are normal.  Essentially normal exam aside from tachycardia.  ____________________________________________   LABS (all labs ordered are listed, but only abnormal results are displayed)  Labs Reviewed  BASIC METABOLIC PANEL - Abnormal; Notable for the following:    Glucose, Bld 109 (*)    All other components within normal limits  CBC  TSH  HCG, QUANTITATIVE, PREGNANCY   ____________________________________________  EKG  Reviewed and interpreted by me first EKG at 1240 SVT at a ventricular rate 200 There is notable T-wave depressions likely rate related versus possible subendocardial ischemia QTc is 400 QRS is 70 Reviewed and interpreted as SVT with extreme tachycardia  Second EKG performed at 1252 SVT at a ventricular rate 190 There is notable T-wave depressions likely rate related versus possible subendocardial ischemia QTc is 400 QRS is 70 Reviewed and interpreted as SVT with extreme tachycardia  Third EKG performed at 1255 SVT at a ventricular rate 140 There is notable T-wave depressions likely rate related versus possible subendocardial ischemia QTc is 440 QRS is 80 Reviewed and interpreted as sinus tachycardia, in marked improvement in T waves depressions is notable the patient has pain and symptom-free at this  time.     ____________________________________________  RADIOLOGY   ____________________________________________   PROCEDURES  Procedure(s) performed: None  Critical Care performed: Yes, see critical care note(s)  CRITICAL CARE Performed by: Sharyn Creamer   Total critical care time: 35  Critical care time was exclusive of separately billable procedures and treating other patients.  Critical care was necessary to treat or prevent imminent or life-threatening deterioration.  Critical care was time spent personally by me on the following activities: development of treatment plan with patient and/or surrogate as well as nursing, discussions with consultants, evaluation of patient's response to treatment, examination of patient, obtaining history from patient or surrogate, ordering and performing treatments and interventions, ordering and review of laboratory studies, ordering and review of radiographic studies, pulse oximetry and re-evaluation of patient's condition.  Patient presents with extreme tachycardia requiring 2 doses of IV anti-rhythmic medication. Due to extreme tachycardia the patient did  require immediate evaluation by ER physician to rule out hemodynamicinstability. ____________________________________________   INITIAL IMPRESSION / ASSESSMENT AND PLAN / ED COURSE  Pertinent labs & imaging results that were available during my care of the patient were reviewed by me and considered in my medical decision making (see chart for details).  Patient with a known history of SVT presents today in SVT with palpitations.  Patient much improved after second dose of adenosine. Currently resting comfortably and asymptomatic. We will obtain basic labs including a electrolytes and a TSH as she is on thyroid replacement therapy.  ----------------------------------------- 2:42 PM on 05/31/2015 -----------------------------------------  Patient reports she feels well no  symptoms. Return precautions and close follow-up care with [facility] cardiology discussed. I reviewed EKG, clinical history, with Dr. Tenny Craw on call for cardiology. She advises that it is okay to discharge the patient and have her follow-up closely with them, they will recheck her this week for a follow-up appointment. I think is very agreeable. ____________________________________________   FINAL CLINICAL IMPRESSION(S) / ED DIAGNOSES  Final diagnoses:  PSVT (paroxysmal supraventricular tachycardia) (HCC)      Sharyn Creamer, MD 05/31/15 1443

## 2015-05-31 NOTE — Discharge Instructions (Signed)
Paroxysmal Supraventricular Tachycardia Paroxysmal supraventricular tachycardia (PSVT) is a type of abnormal heart rhythm. It causes your heart to beat very quickly and then suddenly stop beating so quickly. A normal heart rate is 60-100 beats per minute. During an episode of PSVT, your heart rate may be 150-250 beats per minute. This can make you feel light-headed and short of breath. An episode of PSVT can be frightening. It is usually not dangerous. The heart has four chambers. All chambers need to work together for the heart to beat effectively. A normal heartbeat usually starts in the right upper chamber of the heart (atrium) when an area (sinoatrial node) puts out an electrical signal that spreads to the other chambers. People with PSVT may have abnormal electrical pathways, or they may have other areas in the upper chambers that send out electrical signals. The result is a very rapid heartbeat. When your heart beats very quickly, it does not have time to fill completely with blood. When PSVT happens often or it lasts for long periods, it can lead to heart weakness and failure. Most people with PSVT do not have any other heart disease. CAUSES Abnormal electrical activity in the heart causes PSVT. It is not known why some people get PSVT and others do not. RISK FACTORS You may be more likely to have PSVT if:  You are 20-30 years old.  You are a woman. Other factors that may increase your chances of an attack include:  Stress.  Being tired.  Smoking.  Stimulant drugs.  Alcoholic drinks.  Caffeine.  Pregnancy. SIGNS AND SYMPTOMS A mild episode of PSVT may cause no symptoms. If you do have signs and symptoms, they may include:  A pounding heart.  Feeling of skipped heartbeats (palpitations).  Weakness.  Shortness of breath.  Tightness or pain in your chest.  Light-headedness.  Anxiety.  Dizziness.  Sweating.  Nausea.  A fainting spell. DIAGNOSIS Your health care  provider may suspect PSVT if you have symptoms that come and go. The health care provider will do a physical exam. If you are having an episode during the exam, the health care provider may be able to diagnose PSVT by listening to your heart and feeling your pulse. Tests may also be done, including:  An electrical study of your heart (electrocardiogram, or ECG).  A test in which you wear a portable ECG monitor all day (Holter monitor) or for several days (event monitor).  A test that involves taking an image of your heart using sound waves (echocardiogram) to rule out other causes of a fast heart rate. TREATMENT You may not need treatment if episodes of PSVT do not happen often or if they do not cause symptoms. If PSVT episodes do cause symptoms, your health care provider may first suggest trying a self-treatment called vagus nerve stimulation. The vagus nerve extends down from the brain. It regulates certain body functions. Stimulating this nerve can slow down the heart. Your health care provider can teach you ways to do this. You may need to try a few ways to find what works best for you. Options include:  Holding your breath and pushing, as though you are having a bowel movement.  Massaging an area on one side of your neck below your jaw.  Bending forward with your head between your legs.  Bending forward with your head between your legs and coughing.  Massaging your eyeballs with your eyes closed. If vagus nerve stimulation does not work, other treatment options include:    Medicines to prevent an attack.  Being treated in the hospital with medicine or electric shock to stop an attack (cardioversion). This treatment can include:  Getting medicine through an IV line.  Having a small electric shock delivered to your heart. You will be given medicine to make you sleep through this procedure.  If you have frequent episodes with symptoms, you may need a procedure to get rid of the faulty  areas of your heart (radiofrequency ablation) and end the episodes of PSVT. In this procedure:  A long, thin tube (catheter) is passed through one of your veins into your heart.  Energy directed through the catheter eliminates the areas of your heart that are causing abnormal electric stimulation. HOME CARE INSTRUCTIONS  Take medicines only as directed by your health care provider.  Do not use caffeine in any form if caffeine triggers episodes of PSVT. Otherwise, consume caffeine in moderation. This means no more than a few cups of coffee or the equivalent each day.  Do not drink alcohol if alcohol triggers episodes of PSVT. Otherwise, limit alcohol intake to no more than 1 drink per day for nonpregnant women and 2 drinks per day for men. One drink equals 12 ounces of beer, 5 ounces of wine, or 1 ounces of hard liquor.  Do not use any tobacco products, including cigarettes, chewing tobacco, or electronic cigarettes. If you need help quitting, ask your health care provider.  Try to get at least 7 hours of sleep each night.  Find healthy ways to manage stress.  Perform vagus nerve stimulation as directed by your health care provider.  Maintain a healthy weight.  Get some exercise on most days. Ask your health care provider to suggest some good activities for you. SEEK MEDICAL CARE IF:  You are having episodes of PSVT more often, or they are lasting longer.  Vagus nerve stimulation is no longer helping.  You have new symptoms during an episode. SEEK IMMEDIATE MEDICAL CARE IF:  You have chest pain or trouble breathing.  You have an episode of PSVT that has lasted longer than 20 minutes.  You have passed out from an episode of PSVT. These symptoms may represent a serious problem that is an emergency. Do not wait to see if the symptoms will go away. Get medical help right away. Call your local emergency services (911 in the [place] Do not drive yourself to the hospital.   This  information is not intended to replace advice given to you by your health care provider. Make sure you discuss any questions you have with your health care provider.   Document Released: 08/09/2005 Document Revised: 08/30/2014 Document Reviewed: 01/17/2014 Elsevier Interactive Patient Education 2016 [employer]  

## 2015-06-30 ENCOUNTER — Ambulatory Visit (INDEPENDENT_AMBULATORY_CARE_PROVIDER_SITE_OTHER): Payer: 59 | Admitting: Internal Medicine

## 2015-06-30 ENCOUNTER — Encounter: Payer: Self-pay | Admitting: Internal Medicine

## 2015-06-30 VITALS — BP 124/90 | HR 79 | Ht 67.0 in | Wt 176.6 lb

## 2015-06-30 DIAGNOSIS — I471 Supraventricular tachycardia: Secondary | ICD-10-CM

## 2015-06-30 NOTE — Progress Notes (Signed)
PCP: Tomi BambergerFULLER,SUSAN, NP Primary Cardiologist:  Dr Corbin AdeBerry  Christine Moreno is a 24 y.o. female who presents today for routine electrophysiology followup.  Since last being seen in our clinic, the patient reports doing well.  She did have an episode of abrupt onset SVT 05/2015.  This is documented to be short RP SVT at 190 bpm.  Her tachycardia terminated with adenosine.  She has had no further episodes.  Today, she denies symptoms of palpitations, chest pain, shortness of breath,  lower extremity edema, presyncope, or syncope.  The patient is otherwise without complaint today.   Past Medical History  Diagnosis Date  . Hypotension     previously treated for POTS by Dr Lily KocherGomez  . PSVT (paroxysmal supraventricular tachycardia) (HCC)     Has had 5 total episodes in her life. One was when she went to ED 06/15/13 and one other about a month earlier.  . IBS (irritable bowel syndrome)   . Epigastric pain   . Dysphagia    Past Surgical History  Procedure Laterality Date  . Appendectomy  08/1993  . Left knee arthroscopy      Current Outpatient Prescriptions  Medication Sig Dispense Refill  . ARMOUR THYROID 60 MG tablet Take 60 mg by mouth every morning.  3  . diltiazem (CARDIZEM) 30 MG tablet Take 30 mg by mouth daily as needed (for tachycardia.).     Marland Kitchen. drospirenone-ethinyl estradiol (YASMIN,ZARAH,SYEDA) 3-0.03 MG tablet Take 1 tablet by mouth daily.     No current facility-administered medications for this visit.    Physical Exam: Filed Vitals:   06/30/15 1207  BP: 124/90  Pulse: 79  Height: 5\' 7"  (1.702 m)  Weight: 176 lb 9.6 oz (80.105 kg)    GEN- The patient is well appearing, alert and oriented x 3 today.   Head- normocephalic, atraumatic Eyes-  Sclera clear, conjunctiva pink Ears- hearing intact Oropharynx- clear Lungs- Clear to ausculation bilaterally, normal work of breathing Heart- Regular rate and rhythm, no murmurs, rubs or gallops, PMI not laterally displaced GI- soft, NT,  ND, + BS Extremities- no clubbing, cyanosis, or edema  ekg today reveals sinus rhythm 79 bpm, otherwise normal ekg  Assessment and Plan: The patient has symptomatic paroxysmal supraventricular tachycardia. This has been documented to be short RP by recent ekg.  She also has POTS.  Though I have advised ablation again at this time, she prefers to avoid this.  She would like to continue her current therapy.      Return in 1 year  Hillis RangeJames Gracianna Vink MD, [facility] CenterFACC 06/30/2015 12:49 PM

## 2015-06-30 NOTE — Patient Instructions (Signed)

## 2016-07-07 ENCOUNTER — Ambulatory Visit (HOSPITAL_COMMUNITY)
Admission: EM | Admit: 2016-07-07 | Discharge: 2016-07-07 | Disposition: A | Discharge status: Home / Self Care | Payer: 59 | Attending: Emergency Medicine | Admitting: Emergency Medicine

## 2016-07-07 ENCOUNTER — Encounter (HOSPITAL_COMMUNITY): Payer: Self-pay | Admitting: Family Medicine

## 2016-07-07 DIAGNOSIS — H109 Unspecified conjunctivitis: Secondary | ICD-10-CM

## 2016-07-07 DIAGNOSIS — B9689 Other specified bacterial agents as the cause of diseases classified elsewhere: Secondary | ICD-10-CM

## 2016-07-07 HISTORY — DX: Disorder of thyroid, unspecified: E07.9

## 2016-07-07 MED ORDER — POLYMYXIN B-TRIMETHOPRIM 10000-0.1 UNIT/ML-% OP SOLN: 1.0000 [drp] | OPHTHALMIC | 0 refills | Status: AC

## 2016-07-07 NOTE — ED Provider Notes (Signed)
CSN: [id number]     Arrival date & time 07/07/16  1128 History   First MD Initiated Contact with Patient 07/07/16 1234     Chief Complaint  Patient presents with  . Conjunctivitis   (Consider location/radiation/quality/duration/timing/severity/associated sxs/prior Treatment) Ms. Christine Moreno is a well-appearing 25 y.o female, presents today for bilateral eye redness. Patient reports a sudden onset of right eye redness yesterday and progressed to the left eye today. She reports matting of lashes and eyelid in the morning. She reports green discharge from her eyes. She reports eye pain and describe the pain as a burning and stinging sensation. She has photosensitivity. She denies visual disturbances. She reports fever of highest at home of 99.7 at home. She also have been having URI symptoms with coughing, congestion, running nose, and sore throat for 1 week. She denies eye itchiness.       Past Medical History:  Diagnosis Date  . Dysphagia   . Epigastric pain   . Hypotension    previously treated for POTS by Dr Lily Kocher  . IBS (irritable bowel syndrome)   . PSVT (paroxysmal supraventricular tachycardia) (HCC)    Has had 5 total episodes in her life. One was when she went to ED 06/15/13 and one other about a month earlier.  . Thyroid disease    Past Surgical History:  Procedure Laterality Date  . APPENDECTOMY  08/1993  . left knee arthroscopy     Family History  Problem Relation Age of Onset  . Hyperlipidemia Mother   . Hypertension Father   . Hyperlipidemia Father   . Heart disease Father     Stent & bypass surgery in early 79s  . Hypertension Sister 67  . Heart disease Maternal Grandfather   . Thyroid disease Neg Hx    Social History  Substance Use Topics  . Smoking status: Former Smoker    Quit date: 02/21/2011  . Smokeless tobacco: Never Used  . Alcohol use Yes     Comment: Socially   OB History    No data available     Review of Systems  Constitutional: Positive for  fatigue and fever. Negative for chills.  HENT: Positive for congestion, rhinorrhea, sneezing and sore throat. Negative for ear pain.   Eyes: Positive for photophobia, pain, discharge and redness. Negative for itching and visual disturbance.  Respiratory: Positive for cough. Negative for shortness of breath.   Cardiovascular: Negative for chest pain and palpitations.  Gastrointestinal: Negative for abdominal pain, diarrhea, nausea and vomiting.  Skin: Negative for rash.  Neurological: Positive for headaches. Negative for dizziness and syncope.    Allergies  Patient has no known allergies.  Home Medications   Prior to Admission medications   Medication Sig Start Date End Date Taking? Authorizing Provider  ARMOUR THYROID 60 MG tablet Take 60 mg by mouth every morning. 05/18/15   Historical Provider, MD  diltiazem (CARDIZEM) 30 MG tablet Take 30 mg by mouth daily as needed (for tachycardia.).  07/25/13   Hillis Range, MD  drospirenone-ethinyl estradiol (YASMIN,ZARAH,SYEDA) 3-0.03 MG tablet Take 1 tablet by mouth daily.    Historical Provider, MD  trimethoprim-polymyxin b (POLYTRIM) ophthalmic solution Place 1 drop into both eyes every 4 (four) hours. 07/07/16 07/12/16  Lucia Estelle, NP   Meds Ordered and Administered this Visit  Medications - No data to display  BP 145/89   Pulse 101   Temp 98.9 F (37.2 C) Comment: had advil at 4 am.   Resp 18   LMP 07/07/2016  SpO2 100%  No data found.   Physical Exam  Constitutional: She is oriented to person, place, and time. She appears well-developed and well-nourished.  HENT:  Head: Normocephalic and atraumatic.  Right Ear: External ear normal.  Left Ear: External ear normal.  Nose: Nose normal.  Mouth/Throat: Oropharynx is clear and moist. No oropharyngeal exudate.  TM normal bilaterally  Eyes: EOM are normal. Pupils are equal, round, and reactive to light.  Conjunctival injection present bilaterally, has mild R eyelid edema, no  discharge noted. Visual acuity intact.  Neck: Normal range of motion. Neck supple.  Cardiovascular: Normal rate, regular rhythm and normal heart sounds.   Pulmonary/Chest: Effort normal and breath sounds normal.  Lymphadenopathy:    She has no cervical adenopathy.  Neurological: She is alert and oriented to person, place, and time.  Skin: Skin is warm and dry.  Nursing note and vitals reviewed.   Urgent Care Course   Clinical Course     Procedures (including critical care time)  Labs Review Labs Reviewed - No data to display  Imaging Review No results found.        MDM   1. Bacterial conjunctivitis of both eyes    Prescriptions given (see above). Reviewed directions for usage and side effects. Patient states understanding and will call with questions or problems. Patient instructed to call or follow up with his/her primary care doctor if failure to improve or change in symptoms. Discharge instruction given.     Lucia EstelleFeng Adah Stoneberg, NP 07/07/16 1319

## 2016-07-07 NOTE — ED Triage Notes (Signed)
Pt here for virus x 1 week and then 2 days ago she developed pink eye. Bilateral eye redness and swelling with drainage.

## 2020-05-14 ENCOUNTER — Ambulatory Visit
Admission: EM | Admit: 2020-05-14 | Discharge: 2020-05-14 | Disposition: A | Discharge status: Home / Self Care | Payer: Managed Care, Other (non HMO)

## 2020-05-14 ENCOUNTER — Other Ambulatory Visit: Payer: Self-pay

## 2020-05-14 ENCOUNTER — Ambulatory Visit: Admit: 2020-05-14 | Disposition: A | Discharge status: Home / Self Care | Payer: Self-pay

## 2020-05-14 DIAGNOSIS — L03011 Cellulitis of right finger: Secondary | ICD-10-CM

## 2020-05-14 MED ORDER — CEPHALEXIN 500 MG PO CAPS: 500.0000 mg | ORAL_CAPSULE | Freq: Two times a day (BID) | ORAL | 0 refills | Status: AC

## 2020-05-14 NOTE — ED Triage Notes (Signed)
Pt is here with right middle finger that is infected, pt has used to relieve discomfort.

## 2020-05-14 NOTE — ED Provider Notes (Signed)
[facility]   [id number] 05/14/20 Arrival Time: 1659  CC: RASH  SUBJECTIVE:  Christine Moreno is a 29 y.o. female who presents with a skin complaint that began about 2 days ago. Reports that she has redness, swelling and tenderness to the tip of the R middle finger. Denies precipitating event or trauma. Denies changes in soaps, detergents, close contacts with similar rash, known trigger or environmental trigger, allergy. Denies medications change or starting a new medication recently.  Has not attempted OTC treatment. There are no aggravating or alleviating factors. Denies similar symptoms in the past.  Denies fever, chills, nausea, vomiting, discharge, oral lesions, SOB, chest pain, abdominal pain, changes in bowel or bladder function.    ROS: As per HPI.  All other pertinent ROS negative.     Past Medical History:  Diagnosis Date  . Dysphagia   . Epigastric pain   . Hypotension    previously treated for POTS by Dr Lily Kocher  . IBS (irritable bowel syndrome)   . PSVT (paroxysmal supraventricular tachycardia) (HCC)    Has had 5 total episodes in her life. One was when she went to ED 06/15/13 and one other about a month earlier.  . Thyroid disease    Past Surgical History:  Procedure Laterality Date  . APPENDECTOMY  08/1993  . left knee arthroscopy     No Known Allergies No current facility-administered medications on file prior to encounter.   Current Outpatient Medications on File Prior to Encounter  Medication Sig Dispense Refill  . buPROPion (WELLBUTRIN SR) 150 MG 12 hr tablet Take by mouth.    . busPIRone (BUSPAR) 7.5 MG tablet Take by mouth.    Mack Guise THYROID 60 MG tablet Take 60 mg by mouth every morning.  3  . diltiazem (CARDIZEM) 30 MG tablet Take 30 mg by mouth daily as needed (for tachycardia.).     Marland Kitchen drospirenone-ethinyl estradiol (YASMIN,ZARAH,SYEDA) 3-0.03 MG tablet Take 1 tablet by mouth daily.     Social History   Socioeconomic History  . Marital  status: Single    Spouse name: Not on file  . Number of children: Not on file  . Years of education: Not on file  . Highest education level: Not on file  Occupational History  . Not on file  Tobacco Use  . Smoking status: Former Smoker    Quit date: 02/21/2011    Years since quitting: 9.2  . Smokeless tobacco: Never Used  Substance and Sexual Activity  . Alcohol use: Yes    Comment: Socially  . Drug use: No  . Sexual activity: Yes    Birth control/protection: None  Other Topics Concern  . Not on file  Social History Narrative   Lives in [place] with her parents.  She works at a [employer].   Social Determinants of Health   Financial Resource Strain:   . Difficulty of Paying Living Expenses: Not on file  Food Insecurity:   . Worried About Programme researcher, broadcasting/film/video in the Last Year: Not on file  . Ran Out of Food in the Last Year: Not on file  Transportation Needs:   . Lack of Transportation (Medical): Not on file  . Lack of Transportation (Non-Medical): Not on file  Physical Activity:   . Days of Exercise per Week: Not on file  . Minutes of Exercise per Session: Not on file  Stress:   . Feeling of Stress : Not on file  Social Connections:   . Frequency  of Communication with Friends and Family: Not on file  . Frequency of Social Gatherings with Friends and Family: Not on file  . Attends Religious Services: Not on file  . Active Member of Clubs or Organizations: Not on file  . Attends Banker Meetings: Not on file  . Marital Status: Not on file  Intimate Partner Violence:   . Fear of Current or Ex-Partner: Not on file  . Emotionally Abused: Not on file  . Physically Abused: Not on file  . Sexually Abused: Not on file   Family History  Problem Relation Age of Onset  . Hyperlipidemia Mother   . Hypertension Father   . Hyperlipidemia Father   . Heart disease Father        Stent & bypass surgery in early 31s  . Hypertension Sister 46  . Heart disease  Maternal Grandfather   . Thyroid disease Neg Hx     OBJECTIVE: Vitals:   05/14/20 1706  BP: 127/85  Pulse: 73  Resp: 18  Temp: 98.8 F (37.1 C)  TempSrc: Oral  SpO2: 96%    General appearance: alert; no distress Head: NCAT Lungs: clear to auscultation bilaterally Heart: regular rate and rhythm.  Radial pulse 2+ bilaterally Extremities: no edema Skin: warm and dry; erythema, swelling, tenderness to medial aspect of R middle finger, no drainage present Psychological: alert and cooperative; normal mood and affect  ASSESSMENT & PLAN:  1. Paronychia of finger, right     Meds ordered this encounter  Medications  . cephALEXin (KEFLEX) 500 MG capsule    Sig: Take 1 capsule (500 mg total) by mouth 2 (two) times daily for 7 days.    Dispense:  14 capsule    Refill:  0    Order Specific Question:   Supervising Provider    Answer:   Merrilee Jansky [[id number]]    Prescribed Keflex Declines having the area opened to drain in the office today Take as prescribed and to completion Avoid hot showers/ baths Moisturize skin daily  Follow up with PCP if symptoms persists Return or go to the ER if you have any new or worsening symptoms such as fever, chills, nausea, vomiting, redness, swelling, discharge, if symptoms do not improve with medications  Reviewed expectations re: course of current medical issues. Questions answered. Outlined signs and symptoms indicating need for more acute intervention. Patient verbalized understanding. After Visit Summary given.   Moshe Cipro, NP 05/15/20 0840

## 2020-05-14 NOTE — Discharge Instructions (Signed)
I have sent in Keflex for you to take twice a day for 7 days  Follow up with this office or with primary care as needed

## 2021-07-28 ENCOUNTER — Other Ambulatory Visit: Payer: Self-pay | Admitting: Internal Medicine

## 2021-07-28 DIAGNOSIS — M79601 Pain in right arm: Secondary | ICD-10-CM

## 2021-07-28 DIAGNOSIS — M79602 Pain in left arm: Secondary | ICD-10-CM

## 2021-07-28 DIAGNOSIS — R202 Paresthesia of skin: Secondary | ICD-10-CM

## 2021-08-07 ENCOUNTER — Ambulatory Visit
Admission: RE | Admit: 2021-08-07 | Discharge: 2021-08-07 | Disposition: A | Discharge status: Home / Self Care | Payer: Managed Care, Other (non HMO) | Source: Ambulatory Visit | Attending: Internal Medicine | Admitting: Internal Medicine

## 2021-08-07 DIAGNOSIS — M79601 Pain in right arm: Secondary | ICD-10-CM

## 2021-08-07 DIAGNOSIS — M79602 Pain in left arm: Secondary | ICD-10-CM

## 2021-08-07 DIAGNOSIS — R202 Paresthesia of skin: Secondary | ICD-10-CM | POA: Insufficient documentation

## 2021-08-07 MED ORDER — GADOBUTROL 1 MMOL/ML IV SOLN
7.5000 mL | Freq: Once | INTRAVENOUS | Status: AC | PRN
  Administered 2021-08-07: 7.5 mL via INTRAVENOUS

## 2022-05-13 ENCOUNTER — Other Ambulatory Visit: Payer: Self-pay

## 2022-05-13 ENCOUNTER — Encounter (HOSPITAL_BASED_OUTPATIENT_CLINIC_OR_DEPARTMENT_OTHER): Payer: Self-pay | Admitting: Obstetrics and Gynecology

## 2022-05-13 ENCOUNTER — Emergency Department (HOSPITAL_BASED_OUTPATIENT_CLINIC_OR_DEPARTMENT_OTHER)
Admission: EM | Admit: 2022-05-13 | Discharge: 2022-05-13 | Disposition: A | Discharge status: Home / Self Care | Payer: Managed Care, Other (non HMO) | Attending: Emergency Medicine | Admitting: Emergency Medicine

## 2022-05-13 ENCOUNTER — Emergency Department (HOSPITAL_BASED_OUTPATIENT_CLINIC_OR_DEPARTMENT_OTHER): Payer: Managed Care, Other (non HMO) | Admitting: Radiology

## 2022-05-13 DIAGNOSIS — E039 Hypothyroidism, unspecified: Secondary | ICD-10-CM | POA: Diagnosis not present

## 2022-05-13 DIAGNOSIS — R0609 Other forms of dyspnea: Secondary | ICD-10-CM | POA: Diagnosis not present

## 2022-05-13 DIAGNOSIS — R0602 Shortness of breath: Secondary | ICD-10-CM | POA: Diagnosis present

## 2022-05-13 DIAGNOSIS — M7989 Other specified soft tissue disorders: Secondary | ICD-10-CM | POA: Insufficient documentation

## 2022-05-13 DIAGNOSIS — Z79899 Other long term (current) drug therapy: Secondary | ICD-10-CM | POA: Insufficient documentation

## 2022-05-13 DIAGNOSIS — I10 Essential (primary) hypertension: Secondary | ICD-10-CM | POA: Diagnosis not present

## 2022-05-13 DIAGNOSIS — R55 Syncope and collapse: Secondary | ICD-10-CM | POA: Insufficient documentation

## 2022-05-13 LAB — BASIC METABOLIC PANEL
Anion gap: 11 (ref 5–15)
BUN: 10 mg/dL (ref 6–20)
CO2: 26 mmol/L (ref 22–32)
Calcium: 9.3 mg/dL (ref 8.9–10.3)
Chloride: 104 mmol/L (ref 98–111)
Creatinine, Ser: 0.68 mg/dL (ref 0.44–1.00)
GFR, Estimated: >60 mL/min (ref >60)
Glucose, Bld: 87 mg/dL (ref 70–99)
Potassium: 3.7 mmol/L (ref 3.5–5.1)
Sodium: 141 mmol/L (ref 135–145)

## 2022-05-13 LAB — CBC
HCT: 43.1 % (ref 36.0–46.0)
Hemoglobin: 14.3 g/dL (ref 12.0–15.0)
MCH: 28 pg (ref 26.0–34.0)
MCHC: 33.2 g/dL (ref 30.0–36.0)
MCV: 84.5 fL (ref 80.0–100.0)
Platelets: 406 10*3/uL — ABNORMAL HIGH (ref 150–400)
RBC: 5.1 MIL/uL (ref 3.87–5.11)
RDW: 12.7 % (ref 11.5–15.5)
WBC: 9.2 10*3/uL (ref 4.0–10.5)
nRBC: 0 % (ref 0.0–0.2)

## 2022-05-13 LAB — URINALYSIS, ROUTINE W REFLEX MICROSCOPIC
Bilirubin Urine: NEGATIVE
Glucose, UA: NEGATIVE mg/dL
Hgb urine dipstick: NEGATIVE
Ketones, ur: NEGATIVE mg/dL
Nitrite: NEGATIVE
Protein, ur: NEGATIVE mg/dL
Specific Gravity, Urine: 1.024 (ref 1.005–1.030)
pH: 6 (ref 5.0–8.0)

## 2022-05-13 LAB — PREGNANCY, URINE: Preg Test, Ur: NEGATIVE

## 2022-05-13 LAB — TSH: TSH: 0.849 u[IU]/mL (ref 0.350–4.500)

## 2022-05-13 LAB — D-DIMER, QUANTITATIVE: D-Dimer, Quant: 0.36 ug/mL-FEU (ref 0.00–0.50)

## 2022-05-13 LAB — BRAIN NATRIURETIC PEPTIDE: B Natriuretic Peptide: 42.9 pg/mL (ref 0.0–100.0)

## 2022-05-13 LAB — TROPONIN I (HIGH SENSITIVITY): Troponin I (High Sensitivity): <2 ng/L (ref <18)

## 2022-05-13 NOTE — ED Triage Notes (Signed)
Patient reports for almost a year she has been having leg swelling. Shortness of breath and high blood pressure. Patient states she feels her right leg is worse than her left in her opinion.

## 2022-05-13 NOTE — ED Provider Notes (Signed)
[facility] EMERGENCY DEPT Provider Note   CSN: [id number] Arrival date & time: 05/13/22  1421     History  Chief Complaint  Patient presents with   Shortness of Breath   Leg Swelling    Christine Moreno is a 31 y.o. female.  The history is provided by the patient and medical records. No language interpreter was used.  Shortness of Breath    Patient is a 31 year old female with a PMH significant for uncontrolled HTN and hypothyroidism presenting with a chief complaint of shortness of breath and bilateral leg swelling. She reports she has been experiencing bilaterally leg swelling for about a year. Her PCP prescribed lasix as needed. Patient states she has to take it everyday to relive the swelling. The swelling is worse at the end of her work days. She also states she has been having dyspnea on exertion for about 6 months. She states she gets out of breath and notices that it is worse when laying down at night. Patient reports she had a "near syncope" episode this morning. States that she stood up and felt like she was going to pass out which prompted her to come in today. She denies fever, cough, wheezing, palpitations, n/v/d, numbess or tingling.   Home Medications Prior to Admission medications   Medication Sig Start Date End Date Taking? Authorizing Provider  ARMOUR THYROID 60 MG tablet Take 60 mg by mouth every morning. 05/18/15   [provider]  buPROPion (WELLBUTRIN SR) 150 MG 12 hr tablet Take by mouth. 09/20/17   [provider]  busPIRone (BUSPAR) 7.5 MG tablet Take by mouth. 09/22/17   [provider]  diltiazem (CARDIZEM) 30 MG tablet Take 30 mg by mouth daily as needed (for tachycardia.).  07/25/13   Allred, Fayrene Fearing, MD  drospirenone-ethinyl estradiol (YASMIN,ZARAH,SYEDA) 3-0.03 MG tablet Take 1 tablet by mouth daily.    [provider]      Allergies    Patient has no known allergies.    Review of Systems   Review of  Systems  Respiratory:  Positive for shortness of breath.   All other systems reviewed and are negative.   Physical Exam Updated Vital Signs BP 127/81   Pulse 81   Temp 98.2 F (36.8 C)   Resp 15   Ht 5' 7.25" (1.708 m)   Wt 104.3 kg   LMP 04/21/2022 (Approximate)   SpO2 98%   BMI 35.76 kg/m  Physical Exam Vitals and nursing note reviewed.  Constitutional:      General: She is not in acute distress.    Appearance: She is well-developed. She is obese.  HENT:     Head: Atraumatic.  Eyes:     Conjunctiva/sclera: Conjunctivae normal.  Neck:     Vascular: No JVD.  Cardiovascular:     Rate and Rhythm: Normal rate and regular rhythm.  Pulmonary:     Effort: Pulmonary effort is normal.     Breath sounds: No decreased breath sounds, wheezing, rhonchi or rales.  Chest:     Chest wall: No tenderness.  Abdominal:     Palpations: Abdomen is soft.  Musculoskeletal:     Cervical back: Neck supple.     Right lower leg: No edema.     Left lower leg: No edema.  Skin:    Findings: No rash.  Neurological:     Mental Status: She is alert and oriented to person, place, and time.  Psychiatric:  Mood and Affect: Mood normal.     ED Results / Procedures / Treatments   Labs (all labs ordered are listed, but only abnormal results are displayed) Labs Reviewed  CBC - Abnormal; Notable for the following components:      Result Value   Platelets 406 (*)    All other components within normal limits  URINALYSIS, ROUTINE W REFLEX MICROSCOPIC - Abnormal; Notable for the following components:   Leukocytes,Ua SMALL (*)    Bacteria, UA RARE (*)    All other components within normal limits  BASIC METABOLIC PANEL  PREGNANCY, URINE  BRAIN NATRIURETIC PEPTIDE  D-DIMER, QUANTITATIVE  TSH  TROPONIN I (HIGH SENSITIVITY)  TROPONIN I (HIGH SENSITIVITY)    EKG None  Date: 05/13/2022  Rate: 91  Rhythm: normal sinus rhythm  QRS Axis: normal  Intervals: normal  ST/T Wave  abnormalities: normal  Conduction Disutrbances: none  Narrative Interpretation:   Old EKG Reviewed: No significant changes noted    Radiology DG Chest 2 View  Result Date: 05/13/2022 CLINICAL DATA:  Shortness of breath. EXAM: CHEST - 2 VIEW COMPARISON:  July 04, 2009. FINDINGS: The heart size and mediastinal contours are within normal limits. Both lungs are clear. The visualized skeletal structures are unremarkable. IMPRESSION: No active cardiopulmonary disease. Electronically Signed   By: Lupita Raider M.D.   On: 05/13/2022 14:47    Procedures Procedures    Medications Ordered in ED Medications - No data to display  ED Course/ Medical Decision Making/ A&P                           Medical Decision Making Amount and/or Complexity of Data Reviewed Labs: ordered. Radiology: ordered.   BP 127/81   Pulse 81   Temp 98.2 F (36.8 C)   Resp 15   Ht 5' 7.25" (1.708 m)   Wt 104.3 kg   LMP 04/21/2022 (Approximate)   SpO2 98%   BMI 35.76 kg/m   4:04 PM This is a 31 year old female with history of paroxysmal SVT, hypothyroidism, orthostatic hypotension, who presents with complaints of leg swelling.  Patient report she has had persistent leg swelling for the past year.  She also complaining of recurrent shortness of breath for the past 6 months.  Today she reports feeling lightheadedness when she stood up which prompting this ER visit.  She felt her symptoms started shortly after working third shift requiring her to stand for prolonged period of time.  She has quit the job but her symptoms still persist.  She mention she has been seen and evaluated by her PCP for this and was given Lasix to use as needed.  She is taking Lasix on a daily basis but noticed no significant improvement of her symptoms.  She does not endorse any fever or chills no productive cough no hemoptysis.  She denies any leg pain and no prior history of PE or DVT.  No recent surgery, prolonged bedrest, active  cancer, hemoptysis, or taking oral birth control pill.  She is taking her thyroid medication regularly.  Eating and drinking habit is about the same.  5:18 PM Labs obtained independently viewed interpreted by me.  Normal BNP low suspicion for CHF, normal D-dimer, low suspicion for PE, basic metabolic panel without any electrolyte derangements abnormal kidney function.  Normal WBC, normal H&H.  Troponin is normal low suspicion for ACS.  Pregnancy test is negative.  Urinalysis without concerning infection or protein urine.  TSH is normal doubt thyroid dysfunction.  Chest x-ray unremarkable no evidence of fluid in the lungs to suggest CHF or pneumonia or pneumothorax.  EKG with normal sinus rhythm no arrhythmia and no ischemic changes.  I discussed this with patient and patient felt reassured.  No acute emergent medical condition identified.  Patient can follow-up with PCP for further care.  Return precaution given.  I have considered admission but in the setting of reassuring labs and no acute findings she is stable to be discharged.  I also considered patient's social determinant of health which includes tobacco use and recommend tobacco cessation.         Final Clinical Impression(s) / ED Diagnoses Final diagnoses:  Shortness of breath    Rx / DC Orders ED Discharge Orders     None         Domenic Moras, PA-C 05/13/22 1721    Ezequiel Essex, MD 05/13/22 2336

## 2022-05-13 NOTE — Discharge Instructions (Signed)
You have been evaluated for your symptoms.  Fortunately your labs are reassuring.  Chest x-ray did not show any signs of fluid in your lungs or pneumonia.  Your kidney function is normal.  Your thyroid function is normal.  Please follow-up closely with your doctor for further managements of your condition.  Return if you have any concern.

## 2022-06-01 ENCOUNTER — Other Ambulatory Visit: Payer: Self-pay | Admitting: Family Medicine

## 2022-06-01 DIAGNOSIS — I10 Essential (primary) hypertension: Secondary | ICD-10-CM

## 2022-06-01 DIAGNOSIS — R2243 Localized swelling, mass and lump, lower limb, bilateral: Secondary | ICD-10-CM

## 2022-07-05 ENCOUNTER — Other Ambulatory Visit: Payer: Managed Care, Other (non HMO)

## 2022-07-05 ENCOUNTER — Ambulatory Visit
Admission: RE | Admit: 2022-07-05 | Discharge: 2022-07-05 | Disposition: A | Discharge status: Home / Self Care | Payer: Managed Care, Other (non HMO) | Source: Ambulatory Visit | Attending: Family Medicine | Admitting: Family Medicine

## 2022-07-05 DIAGNOSIS — R2243 Localized swelling, mass and lump, lower limb, bilateral: Secondary | ICD-10-CM

## 2022-07-05 DIAGNOSIS — I10 Essential (primary) hypertension: Secondary | ICD-10-CM

## 2023-09-01 ENCOUNTER — Other Ambulatory Visit: Payer: Self-pay | Admitting: Internal Medicine

## 2023-09-01 DIAGNOSIS — R1031 Right lower quadrant pain: Secondary | ICD-10-CM

## 2023-09-06 ENCOUNTER — Ambulatory Visit: Payer: 59

## 2023-11-22 ENCOUNTER — Ambulatory Visit

## 2023-12-01 ENCOUNTER — Ambulatory Visit: Attending: Internal Medicine

## 2024-05-05 ENCOUNTER — Telehealth
